# Patient Record
Sex: Female | Born: 1935 | Race: White | Hispanic: No | Smoking: Never smoker
Health system: Southern US, Community
[De-identification: ages and names within clinical notes are randomized; demographics above are authoritative.]

## PROBLEM LIST (undated history)

## (undated) DIAGNOSIS — F419 Anxiety disorder, unspecified: Secondary | ICD-10-CM

## (undated) DIAGNOSIS — R609 Edema, unspecified: Secondary | ICD-10-CM

## (undated) DIAGNOSIS — M1990 Unspecified osteoarthritis, unspecified site: Secondary | ICD-10-CM

## (undated) DIAGNOSIS — I10 Essential (primary) hypertension: Secondary | ICD-10-CM

## (undated) DIAGNOSIS — F0390 Unspecified dementia without behavioral disturbance: Secondary | ICD-10-CM

## (undated) HISTORY — PX: OTHER SURGICAL HISTORY: SHX169

## (undated) HISTORY — DX: Edema, unspecified: R60.9

## (undated) HISTORY — DX: Essential (primary) hypertension: I10

---

## 2012-05-20 ENCOUNTER — Encounter (HOSPITAL_BASED_OUTPATIENT_CLINIC_OR_DEPARTMENT_OTHER): Payer: Self-pay | Admitting: *Deleted

## 2012-05-20 ENCOUNTER — Emergency Department (HOSPITAL_BASED_OUTPATIENT_CLINIC_OR_DEPARTMENT_OTHER)
Admission: EM | Admit: 2012-05-20 | Discharge: 2012-05-21 | Disposition: A | Discharge status: Home / Self Care | Payer: Medicare Other | Attending: Emergency Medicine | Admitting: Emergency Medicine

## 2012-05-20 ENCOUNTER — Emergency Department (HOSPITAL_BASED_OUTPATIENT_CLINIC_OR_DEPARTMENT_OTHER): Payer: Medicare Other

## 2012-05-20 DIAGNOSIS — W1809XA Striking against other object with subsequent fall, initial encounter: Secondary | ICD-10-CM | POA: Insufficient documentation

## 2012-05-20 DIAGNOSIS — S0990XA Unspecified injury of head, initial encounter: Secondary | ICD-10-CM | POA: Insufficient documentation

## 2012-05-20 DIAGNOSIS — Y939 Activity, unspecified: Secondary | ICD-10-CM | POA: Insufficient documentation

## 2012-05-20 DIAGNOSIS — F0390 Unspecified dementia without behavioral disturbance: Secondary | ICD-10-CM | POA: Insufficient documentation

## 2012-05-20 DIAGNOSIS — Y929 Unspecified place or not applicable: Secondary | ICD-10-CM | POA: Insufficient documentation

## 2012-05-20 DIAGNOSIS — S0990XS Unspecified injury of head, sequela: Secondary | ICD-10-CM

## 2012-05-20 DIAGNOSIS — F411 Generalized anxiety disorder: Secondary | ICD-10-CM | POA: Insufficient documentation

## 2012-05-20 DIAGNOSIS — S60229A Contusion of unspecified hand, initial encounter: Secondary | ICD-10-CM | POA: Insufficient documentation

## 2012-05-20 DIAGNOSIS — T148XXA Other injury of unspecified body region, initial encounter: Secondary | ICD-10-CM

## 2012-05-20 DIAGNOSIS — I10 Essential (primary) hypertension: Secondary | ICD-10-CM | POA: Insufficient documentation

## 2012-05-20 DIAGNOSIS — Z8739 Personal history of other diseases of the musculoskeletal system and connective tissue: Secondary | ICD-10-CM | POA: Insufficient documentation

## 2012-05-20 HISTORY — DX: Essential (primary) hypertension: I10

## 2012-05-20 HISTORY — DX: Unspecified dementia, unspecified severity, without behavioral disturbance, psychotic disturbance, mood disturbance, and anxiety: F03.90

## 2012-05-20 HISTORY — DX: Anxiety disorder, unspecified: F41.9

## 2012-05-20 HISTORY — DX: Unspecified osteoarthritis, unspecified site: M19.90

## 2012-05-20 NOTE — ED Notes (Signed)
Pt returned from CT °

## 2012-05-20 NOTE — ED Notes (Signed)
Patient transported to CT 

## 2012-05-20 NOTE — ED Provider Notes (Signed)
History    This chart was scribed for Frances Harshberger Smitty Cords, Frances Jackson by Donne Anon, ED Scribe. This patient was seen in room MH11/MH11 and the patient's care was started at 2323.   CSN: [id number]  Arrival date & time 05/20/12  2131   First Frances Jackson Initiated Contact with Patient 05/20/12 2323      Chief Complaint  Patient presents with  . Head Injury     Patient is a 80 y.o. female presenting with head injury. The history is provided by the patient and a relative. No language interpreter was used.  Head Injury Location:  R parietal Time since incident:  16 hours Mechanism of injury: fall   Pain details:    Severity:  Mild   Timing:  Constant   Progression:  Unchanged Chronicity:  New Relieved by:  None tried Worsened by:  Nothing tried Ineffective treatments:  NSAIDs Associated symptoms: no nausea, no numbness and no vomiting   Risk factors: alcohol intake (bottle of wine per day)    Frances Jackson is a 79 y.o. female who presents to the Emergency Department complaining of sudden onset head injury due to a fall which occurred 16 hours PTA in pts assisted living facility while she was getting out of bed. Her daughter reports she hit her head on the corner of the table and reports associated bruising. She denies LOC, emesis, nausea, or any other pain.  Past Medical History  Diagnosis Date  . Anxiety   . Arthritis   . Dementia   . Hypertension     Past Surgical History  Procedure Laterality Date  . Arm surgery      No family history on file.  History  Substance Use Topics  . Smoking status: Never Smoker   . Smokeless tobacco: Never Used  . Alcohol Use: 16.8 oz/week    28 Glasses of wine per week      Review of Systems  Gastrointestinal: Negative for nausea and vomiting.  Neurological: Negative for numbness.  All other systems reviewed and are negative.    Allergies  Review of patient's allergies indicates no known allergies.  Home Medications   Current  Outpatient Rx  Name  Route  Sig  Dispense  Refill  . amLODipine (NORVASC) 5 MG tablet   Oral   Take 5 mg by mouth daily.         . clonazePAM (KLONOPIN) 0.5 MG tablet   Oral   Take 0.5 mg by mouth 2 (two) times daily as needed for anxiety.         . donepezil (ARICEPT) 10 MG tablet   Oral   Take 10 mg by mouth at bedtime as needed.         . nortriptyline (PAMELOR) 50 MG capsule   Oral   Take 50 mg by mouth at bedtime.           BP 130/75  Pulse 108  Temp(Src) 98.1 F (36.7 C) (Oral)  Resp 16  Ht 5' 4.5" (1.638 m)  Wt 135 lb (61.236 kg)  BMI 22.82 kg/m2  SpO2 95%  Physical Exam  Nursing note and vitals reviewed. Constitutional: She appears well-developed and well-nourished. No distress.  HENT:  Head: Normocephalic and atraumatic.  Right Ear: External ear normal.  Left Ear: External ear normal.  Eyes: Conjunctivae and EOM are normal. Pupils are equal, round, and reactive to light. Right eye exhibits no discharge. Left eye exhibits no discharge. No scleral icterus.  Lens implants bilaterally.  Neck: Normal range of motion. Neck supple.  No crepitance. No midline C spine tenderness.  Cardiovascular: Normal rate, regular rhythm and intact distal pulses.   Pulmonary/Chest: Effort normal and breath sounds normal. No respiratory distress. She has no wheezes. She has no rales.  Abdominal: Soft. Bowel sounds are normal. There is no tenderness. There is no rebound and no guarding.  Musculoskeletal: Normal range of motion.  Neurological: She is alert. She has normal reflexes. She displays normal reflexes. No cranial nerve deficit ( no gross defecits noted).  Skin: Skin is warm and dry.  Ecchymosis right forehead.  Psychiatric: She has a normal mood and affect.    ED Course  Procedures (including critical care time) DIAGNOSTIC STUDIES: Oxygen Saturation is 95% on room air, adequate by my interpretation.    COORDINATION OF CARE: 11:32 PM Discussed treatment plan  which includes head CT with pt at bedside and pt agreed to plan.     Labs Reviewed - No data to display No results found.   No diagnosis found.    MDM  Follow up with your family doctor discontinue drinking wine I personally performed the services described in this documentation, which was scribed in my presence. The recorded information has been reviewed and is accurate.         Jasmine Awe, Frances Jackson 05/20/12 2352

## 2012-05-20 NOTE — ED Notes (Signed)
Pt's family member reports pt lives in [facility] assisted living- fell this am and hit on corner of table- denies loc- bruising to forehead

## 2013-05-04 ENCOUNTER — Encounter (HOSPITAL_BASED_OUTPATIENT_CLINIC_OR_DEPARTMENT_OTHER): Payer: Self-pay | Admitting: Emergency Medicine

## 2013-05-04 ENCOUNTER — Emergency Department (HOSPITAL_BASED_OUTPATIENT_CLINIC_OR_DEPARTMENT_OTHER)
Admission: EM | Admit: 2013-05-04 | Discharge: 2013-05-04 | Disposition: A | Discharge status: Home / Self Care | Payer: Medicare Other | Attending: Emergency Medicine | Admitting: Emergency Medicine

## 2013-05-04 DIAGNOSIS — W19XXXA Unspecified fall, initial encounter: Secondary | ICD-10-CM

## 2013-05-04 DIAGNOSIS — Y9301 Activity, walking, marching and hiking: Secondary | ICD-10-CM | POA: Insufficient documentation

## 2013-05-04 DIAGNOSIS — Y9210 Unspecified residential institution as the place of occurrence of the external cause: Secondary | ICD-10-CM | POA: Insufficient documentation

## 2013-05-04 DIAGNOSIS — I10 Essential (primary) hypertension: Secondary | ICD-10-CM | POA: Insufficient documentation

## 2013-05-04 DIAGNOSIS — IMO0002 Reserved for concepts with insufficient information to code with codable children: Secondary | ICD-10-CM | POA: Insufficient documentation

## 2013-05-04 DIAGNOSIS — F411 Generalized anxiety disorder: Secondary | ICD-10-CM | POA: Insufficient documentation

## 2013-05-04 DIAGNOSIS — S0003XA Contusion of scalp, initial encounter: Secondary | ICD-10-CM | POA: Insufficient documentation

## 2013-05-04 DIAGNOSIS — Z8739 Personal history of other diseases of the musculoskeletal system and connective tissue: Secondary | ICD-10-CM | POA: Insufficient documentation

## 2013-05-04 DIAGNOSIS — T07XXXA Unspecified multiple injuries, initial encounter: Secondary | ICD-10-CM

## 2013-05-04 DIAGNOSIS — F0390 Unspecified dementia without behavioral disturbance: Secondary | ICD-10-CM | POA: Insufficient documentation

## 2013-05-04 DIAGNOSIS — S1093XA Contusion of unspecified part of neck, initial encounter: Principal | ICD-10-CM

## 2013-05-04 DIAGNOSIS — W010XXA Fall on same level from slipping, tripping and stumbling without subsequent striking against object, initial encounter: Secondary | ICD-10-CM | POA: Insufficient documentation

## 2013-05-04 DIAGNOSIS — Z79899 Other long term (current) drug therapy: Secondary | ICD-10-CM | POA: Insufficient documentation

## 2013-05-04 DIAGNOSIS — Z23 Encounter for immunization: Secondary | ICD-10-CM | POA: Insufficient documentation

## 2013-05-04 DIAGNOSIS — W1809XA Striking against other object with subsequent fall, initial encounter: Secondary | ICD-10-CM | POA: Insufficient documentation

## 2013-05-04 DIAGNOSIS — S0083XA Contusion of other part of head, initial encounter: Principal | ICD-10-CM

## 2013-05-04 MED ORDER — TETANUS-DIPHTH-ACELL PERTUSSIS 5-2.5-18.5 LF-MCG/0.5 IM SUSP
0.5000 mL | Freq: Once | INTRAMUSCULAR | Status: AC
  Administered 2013-05-04: 0.5 mL via INTRAMUSCULAR
  Filled 2013-05-04: qty 0.5

## 2013-05-04 NOTE — Discharge Instructions (Signed)
Abrasions An abrasion is a cut or scrape of the skin. Abrasions do not go through all layers of the skin. HOME CARE  If a bandage (dressing) was put on your wound, change it as told by your doctor. If the bandage sticks, soak it off with warm.  Wash the area with water and soap 2 times a day. Rinse off the soap. Pat the area dry with a clean towel.  Put on medicated cream (ointment) as told by your doctor.  Change your bandage right away if it gets wet or dirty.  Only take medicine as told by your doctor.  See your doctor within 24 48 hours to get your wound checked.  Check your wound for redness, puffiness (swelling), or yellowish-white fluid (pus). GET HELP RIGHT AWAY IF:   You have more pain in the wound.  You have redness, swelling, or tenderness around the wound.  You have pus coming from the wound.  You have a fever or lasting symptoms for more than 2 3 days.  You have a fever and your symptoms suddenly get worse.  You have a bad smell coming from the wound or bandage. MAKE SURE YOU:   Understand these instructions.  Will watch your condition.  Will get help right away if you are not doing well or get worse. Document Released: 08/30/2007 Document Revised: 12/06/2011 Document Reviewed: 02/14/2011 [facility] Patient Information 2014 [employer], [place].  Fall Prevention and Home Safety Falls cause injuries and can affect all age groups. It is possible to prevent falls.  HOW TO PREVENT FALLS  Wear shoes with rubber soles that do not have an opening for your toes.  Keep the inside and outside of your house well lit.  Use night lights throughout your home.  Remove clutter from floors.  Clean up floor spills.  Remove throw rugs or fasten them to the floor with carpet tape.  Do not place electrical cords across pathways.  Put grab bars by your tub, shower, and toilet. Do not use towel bars as grab bars.  Put handrails on both sides of the stairway. Fix loose  handrails.  Do not climb on stools or stepladders, if possible.  Do not wax your floors.  Repair uneven or unsafe sidewalks, walkways, or stairs.  Keep items you use a lot within reach.  Be aware of pets.  Keep emergency numbers next to the telephone.  Put smoke detectors in your home and near bedrooms. Ask your doctor what other things you can do to prevent falls. Document Released: 01/07/2009 Document Revised: 09/12/2011 Document Reviewed: 06/13/2011 [facility] Patient Information 2014 [place], [place].

## 2013-05-04 NOTE — ED Notes (Signed)
Patient fell this morning, lives in assisted living. Hit head on the grassy area. Abrasions to right side of face and abrasions to both hands.

## 2013-05-04 NOTE — ED Provider Notes (Signed)
CSN: [id number]     Arrival date & time 05/04/13  1341 History  This chart was scribed for Nelia Shi, MD by Donne Anon, ED Scribe. This patient was seen in room MH08/MH08 and the patient's care was started at 1629.    First MD Initiated Contact with Patient 05/04/13 1629     Chief Complaint  Patient presents with  . Fall    The history is provided by the patient and a relative. No language interpreter was used.   HPI Comments: Frances Jackson is a 80 y.o. female who presents to the Emergency Department complaining of a fall which occurred this morning. She states she did not lose consciousness, but does not remember how she fell. He daughter reports that she tripped while walking outside and hit her head on a grassy area. Her daughter denies LOC. Her daughter thinks that her eyes look droopy. She has some abrasions to the right side of her face and both hands. She is not currently on a blood thinner.   Past Medical History  Diagnosis Date  . Anxiety   . Arthritis   . Dementia   . Hypertension    Past Surgical History  Procedure Laterality Date  . Arm surgery     No family history on file. History  Substance Use Topics  . Smoking status: Never Smoker   . Smokeless tobacco: Never Used  . Alcohol Use: 16.8 oz/week    28 Glasses of wine per week   OB History   Grav Para Term Preterm Abortions TAB SAB Ect Mult Living                 Review of Systems  HENT: Positive for facial swelling.   Skin: Positive for wound.  Neurological: Negative for syncope.  All other systems reviewed and are negative.    Allergies  Review of patient's allergies indicates no known allergies.  Home Medications   Current Outpatient Rx  Name  Route  Sig  Dispense  Refill  . furosemide (LASIX) 20 MG tablet   Oral   Take 20 mg by mouth.         . levothyroxine (SYNTHROID, LEVOTHROID) 25 MCG tablet   Oral   Take 25 mcg by mouth daily before breakfast.         . omeprazole  (PRILOSEC) 40 MG capsule   Oral   Take 40 mg by mouth daily.         Marland Kitchen amLODipine (NORVASC) 5 MG tablet   Oral   Take 5 mg by mouth daily.         . clonazePAM (KLONOPIN) 0.5 MG tablet   Oral   Take 0.5 mg by mouth 2 (two) times daily as needed for anxiety.         . donepezil (ARICEPT) 10 MG tablet   Oral   Take 10 mg by mouth at bedtime as needed.         . nortriptyline (PAMELOR) 50 MG capsule   Oral   Take 50 mg by mouth at bedtime.          BP 101/81  Pulse 98  Temp(Src) 98.2 F (36.8 C) (Oral)  Resp 16  Ht 5' (1.524 m)  Wt 133 lb (60.328 kg)  BMI 25.97 kg/m2  SpO2 100%  Physical Exam  Nursing note and vitals reviewed. Constitutional: She is oriented to person, place, and time. She appears well-developed and well-nourished. No distress.  HENT:  Head:  Normocephalic. Head is with contusion.    Eyes: Pupils are equal, round, and reactive to light.  Neck: Normal range of motion.  Cardiovascular: Normal rate and intact distal pulses.   Pulmonary/Chest: No respiratory distress.  Abdominal: Normal appearance. She exhibits no distension.  Musculoskeletal: Normal range of motion.       Hands: Neurological: She is alert and oriented to person, place, and time. No cranial nerve deficit.  Skin: Skin is warm and dry. No rash noted.  Psychiatric: She has a normal mood and affect. Her behavior is normal.    ED Course  Procedures (including critical care time)  Medications  Tdap (BOOSTRIX) injection 0.5 mL (0.5 mLs Intramuscular Given 05/04/13 1651)    DIAGNOSTIC STUDIES: Oxygen Saturation is 100% on RA, normal by my interpretation.    COORDINATION OF CARE: 4:33 PM Discussed treatment plan which includes wound dressing and tetanus shot with pt and daughter at bedside and they agreed to plan.    Labs Review Labs Reviewed - No data to display Imaging Review No results found.  EKG Interpretation   None       MDM   1. Fall   2. Abrasions of  multiple sites   3. Multiple contusions      I personally performed the services described in this documentation, which was scribed in my presence. The recorded information has been reviewed and considered.   Nelia Shiobert L Pearley Millington, MD 05/04/13 [phone]

## 2014-04-20 ENCOUNTER — Other Ambulatory Visit (HOSPITAL_COMMUNITY): Payer: Self-pay | Admitting: Family Medicine

## 2014-04-20 DIAGNOSIS — R112 Nausea with vomiting, unspecified: Secondary | ICD-10-CM

## 2014-04-23 ENCOUNTER — Ambulatory Visit (HOSPITAL_COMMUNITY): Payer: Medicare Other

## 2014-04-30 ENCOUNTER — Ambulatory Visit (HOSPITAL_COMMUNITY): Payer: Medicare Other

## 2015-02-06 ENCOUNTER — Encounter (HOSPITAL_COMMUNITY): Payer: Self-pay | Admitting: Emergency Medicine

## 2015-02-06 ENCOUNTER — Emergency Department (HOSPITAL_COMMUNITY): Payer: Medicare Other

## 2015-02-06 ENCOUNTER — Inpatient Hospital Stay (HOSPITAL_COMMUNITY)
Admission: EM | Admit: 2015-02-06 | Discharge: 2015-02-09 | DRG: 690 | Disposition: A | Discharge status: Skilled Nursing Facility | Payer: Medicare Other | Attending: Internal Medicine | Admitting: Internal Medicine

## 2015-02-06 ENCOUNTER — Inpatient Hospital Stay (HOSPITAL_COMMUNITY): Payer: Medicare Other

## 2015-02-06 DIAGNOSIS — I10 Essential (primary) hypertension: Secondary | ICD-10-CM | POA: Diagnosis present

## 2015-02-06 DIAGNOSIS — E872 Acidosis, unspecified: Secondary | ICD-10-CM | POA: Diagnosis present

## 2015-02-06 DIAGNOSIS — M6282 Rhabdomyolysis: Secondary | ICD-10-CM | POA: Diagnosis present

## 2015-02-06 DIAGNOSIS — S0232XA Fracture of orbital floor, left side, initial encounter for closed fracture: Secondary | ICD-10-CM | POA: Diagnosis present

## 2015-02-06 DIAGNOSIS — M1990 Unspecified osteoarthritis, unspecified site: Secondary | ICD-10-CM | POA: Diagnosis present

## 2015-02-06 DIAGNOSIS — Y92129 Unspecified place in nursing home as the place of occurrence of the external cause: Secondary | ICD-10-CM | POA: Diagnosis not present

## 2015-02-06 DIAGNOSIS — W19XXXA Unspecified fall, initial encounter: Secondary | ICD-10-CM

## 2015-02-06 DIAGNOSIS — E876 Hypokalemia: Secondary | ICD-10-CM | POA: Diagnosis present

## 2015-02-06 DIAGNOSIS — F419 Anxiety disorder, unspecified: Secondary | ICD-10-CM | POA: Diagnosis present

## 2015-02-06 DIAGNOSIS — W1830XA Fall on same level, unspecified, initial encounter: Secondary | ICD-10-CM | POA: Diagnosis present

## 2015-02-06 DIAGNOSIS — F0390 Unspecified dementia without behavioral disturbance: Secondary | ICD-10-CM | POA: Diagnosis present

## 2015-02-06 DIAGNOSIS — N390 Urinary tract infection, site not specified: Principal | ICD-10-CM | POA: Diagnosis present

## 2015-02-06 DIAGNOSIS — Z66 Do not resuscitate: Secondary | ICD-10-CM | POA: Diagnosis present

## 2015-02-06 DIAGNOSIS — M25569 Pain in unspecified knee: Secondary | ICD-10-CM | POA: Diagnosis present

## 2015-02-06 DIAGNOSIS — Z79899 Other long term (current) drug therapy: Secondary | ICD-10-CM | POA: Diagnosis not present

## 2015-02-06 DIAGNOSIS — S0240DA Maxillary fracture, left side, initial encounter for closed fracture: Secondary | ICD-10-CM | POA: Diagnosis present

## 2015-02-06 DIAGNOSIS — M25562 Pain in left knee: Secondary | ICD-10-CM | POA: Diagnosis present

## 2015-02-06 DIAGNOSIS — S8002XA Contusion of left knee, initial encounter: Secondary | ICD-10-CM | POA: Diagnosis present

## 2015-02-06 LAB — CBC WITH DIFFERENTIAL/PLATELET
Basophils Absolute: 0 K/uL (ref 0.0–0.1)
Basophils Relative: 0 %
Eosinophils Absolute: 0 K/uL (ref 0.0–0.7)
Eosinophils Relative: 0 %
HCT: 44.9 % (ref 36.0–46.0)
Hemoglobin: 15.2 g/dL — ABNORMAL HIGH (ref 12.0–15.0)
Lymphocytes Relative: 7 %
Lymphs Abs: 0.9 K/uL (ref 0.7–4.0)
MCH: 33 pg (ref 26.0–34.0)
MCHC: 33.9 g/dL (ref 30.0–36.0)
MCV: 97.6 fL (ref 78.0–100.0)
Monocytes Absolute: 0.5 K/uL (ref 0.1–1.0)
Monocytes Relative: 4 %
Neutro Abs: 12.1 K/uL — ABNORMAL HIGH (ref 1.7–7.7)
Neutrophils Relative %: 89 %
Platelets: 277 K/uL (ref 150–400)
RBC: 4.6 MIL/uL (ref 3.87–5.11)
RDW: 13 % (ref 11.5–15.5)
WBC: 13.6 K/uL — ABNORMAL HIGH (ref 4.0–10.5)

## 2015-02-06 LAB — COMPREHENSIVE METABOLIC PANEL
ALT: 21 U/L (ref 14–54)
ANION GAP: 13 (ref 5–15)
AST: 33 U/L (ref 15–41)
Albumin: 4.4 g/dL (ref 3.5–5.0)
Alkaline Phosphatase: 53 U/L (ref 38–126)
BILIRUBIN TOTAL: 0.7 mg/dL (ref 0.3–1.2)
BUN: 12 mg/dL (ref 6–20)
CHLORIDE: 102 mmol/L (ref 101–111)
CO2: 24 mmol/L (ref 22–32)
Calcium: 9.5 mg/dL (ref 8.9–10.3)
Creatinine, Ser: 0.75 mg/dL (ref 0.44–1.00)
GFR calc Af Amer: >60 mL/min (ref >60)
Glucose, Bld: 145 mg/dL — ABNORMAL HIGH (ref 65–99)
POTASSIUM: 3.2 mmol/L — AB (ref 3.5–5.1)
Sodium: 139 mmol/L (ref 135–145)
TOTAL PROTEIN: 8 g/dL (ref 6.5–8.1)

## 2015-02-06 LAB — BASIC METABOLIC PANEL
Anion gap: 11 (ref 5–15)
BUN: 9 mg/dL (ref 6–20)
CHLORIDE: 107 mmol/L (ref 101–111)
CO2: 22 mmol/L (ref 22–32)
Calcium: 8.9 mg/dL (ref 8.9–10.3)
Creatinine, Ser: 0.65 mg/dL (ref 0.44–1.00)
GFR calc Af Amer: >60 mL/min (ref >60)
GFR calc non Af Amer: >60 mL/min (ref >60)
Glucose, Bld: 149 mg/dL — ABNORMAL HIGH (ref 65–99)
POTASSIUM: 4.5 mmol/L (ref 3.5–5.1)
SODIUM: 140 mmol/L (ref 135–145)

## 2015-02-06 LAB — LACTIC ACID, PLASMA: Lactic Acid, Venous: 1.8 mmol/L (ref 0.5–2.0)

## 2015-02-06 LAB — URINALYSIS, ROUTINE W REFLEX MICROSCOPIC
Bilirubin Urine: NEGATIVE
Glucose, UA: NEGATIVE mg/dL
Ketones, ur: 15 mg/dL — AB
Nitrite: NEGATIVE
Protein, ur: NEGATIVE mg/dL
Specific Gravity, Urine: 1.015 (ref 1.005–1.030)
Urobilinogen, UA: 1 mg/dL (ref 0.0–1.0)
pH: 7 (ref 5.0–8.0)

## 2015-02-06 LAB — I-STAT CG4 LACTIC ACID, ED
LACTIC ACID, VENOUS: 4.33 mmol/L — AB (ref 0.5–2.0)
Lactic Acid, Venous: 3.35 mmol/L (ref 0.5–2.0)

## 2015-02-06 LAB — URINE MICROSCOPIC-ADD ON

## 2015-02-06 LAB — MAGNESIUM: Magnesium: 2 mg/dL (ref 1.7–2.4)

## 2015-02-06 LAB — CK: Total CK: 457 U/L — ABNORMAL HIGH (ref 38–234)

## 2015-02-06 MED ORDER — DEXTROSE 5 % IV SOLN
1.0000 g | Freq: Once | INTRAVENOUS | Status: AC
  Administered 2015-02-06: 1 g via INTRAVENOUS
  Filled 2015-02-06: qty 10

## 2015-02-06 MED ORDER — SODIUM CHLORIDE 0.9 % IV SOLN
INTRAVENOUS | Status: DC
  Administered 2015-02-06: 17:00:00 via INTRAVENOUS

## 2015-02-06 MED ORDER — NORTRIPTYLINE HCL 10 MG PO CAPS
10.0000 mg | ORAL_CAPSULE | Freq: Every day | ORAL | Status: DC
  Administered 2015-02-06 – 2015-02-08 (×3): 10 mg via ORAL
  Filled 2015-02-06 (×4): qty 1

## 2015-02-06 MED ORDER — SIMVASTATIN 20 MG PO TABS
20.0000 mg | ORAL_TABLET | Freq: Every day | ORAL | Status: DC
  Administered 2015-02-06 – 2015-02-08 (×3): 20 mg via ORAL
  Filled 2015-02-06 (×4): qty 1

## 2015-02-06 MED ORDER — SODIUM CHLORIDE 0.9 % IV BOLUS (SEPSIS)
500.0000 mL | Freq: Once | INTRAVENOUS | Status: AC
  Administered 2015-02-06: 500 mL via INTRAVENOUS

## 2015-02-06 MED ORDER — LEVOTHYROXINE SODIUM 50 MCG PO TABS
50.0000 ug | ORAL_TABLET | Freq: Every day | ORAL | Status: DC
  Administered 2015-02-06 – 2015-02-09 (×4): 50 ug via ORAL
  Filled 2015-02-06 (×5): qty 1

## 2015-02-06 MED ORDER — LORAZEPAM 1 MG PO TABS
0.0000 mg | ORAL_TABLET | Freq: Four times a day (QID) | ORAL | Status: AC
  Administered 2015-02-07: 1 mg via ORAL
  Filled 2015-02-06: qty 1

## 2015-02-06 MED ORDER — LORAZEPAM 2 MG/ML IJ SOLN
0.0000 mg | Freq: Two times a day (BID) | INTRAMUSCULAR | Status: AC
  Administered 2015-02-07: 2 mg via INTRAVENOUS
  Filled 2015-02-06: qty 1

## 2015-02-06 MED ORDER — LORAZEPAM 2 MG/ML IJ SOLN: 0.0000 mg | Freq: Four times a day (QID) | INTRAMUSCULAR | Status: AC

## 2015-02-06 MED ORDER — ACETAMINOPHEN 500 MG PO TABS: 500.0000 mg | ORAL_TABLET | Freq: Two times a day (BID) | ORAL | Status: DC | PRN

## 2015-02-06 MED ORDER — SODIUM CHLORIDE 0.9 % IV SOLN
Freq: Once | INTRAVENOUS | Status: AC
  Administered 2015-02-06: 09:00:00 via INTRAVENOUS

## 2015-02-06 MED ORDER — LORAZEPAM 1 MG PO TABS
0.0000 mg | ORAL_TABLET | Freq: Two times a day (BID) | ORAL | Status: DC
  Administered 2015-02-06 – 2015-02-09 (×2): 1 mg via ORAL
  Filled 2015-02-06 (×2): qty 1

## 2015-02-06 MED ORDER — ENOXAPARIN SODIUM 40 MG/0.4ML ~~LOC~~ SOLN
40.0000 mg | SUBCUTANEOUS | Status: DC
  Administered 2015-02-06 – 2015-02-08 (×3): 40 mg via SUBCUTANEOUS
  Filled 2015-02-06 (×4): qty 0.4

## 2015-02-06 MED ORDER — SODIUM CHLORIDE 0.9 % IV BOLUS (SEPSIS)
1000.0000 mL | Freq: Once | INTRAVENOUS | Status: AC
  Administered 2015-02-06: 1000 mL via INTRAVENOUS

## 2015-02-06 MED ORDER — POTASSIUM CHLORIDE CRYS ER 20 MEQ PO TBCR
20.0000 meq | EXTENDED_RELEASE_TABLET | Freq: Two times a day (BID) | ORAL | Status: DC
  Administered 2015-02-06 – 2015-02-09 (×7): 20 meq via ORAL
  Filled 2015-02-06 (×9): qty 1

## 2015-02-06 MED ORDER — CLONAZEPAM 0.5 MG PO TABS
0.5000 mg | ORAL_TABLET | Freq: Two times a day (BID) | ORAL | Status: DC | PRN
  Administered 2015-02-06 – 2015-02-07 (×4): 0.5 mg via ORAL
  Filled 2015-02-06 (×4): qty 1

## 2015-02-06 MED ORDER — SODIUM CHLORIDE 0.9 % IV BOLUS (SEPSIS)
500.0000 mL | Freq: Once | INTRAVENOUS | Status: AC
Start: 2015-02-06 — End: 2015-02-06
  Administered 2015-02-06: 500 mL via INTRAVENOUS

## 2015-02-06 MED ORDER — VITAMIN B-1 100 MG PO TABS
100.0000 mg | ORAL_TABLET | Freq: Every day | ORAL | Status: DC
  Administered 2015-02-06 – 2015-02-09 (×4): 100 mg via ORAL
  Filled 2015-02-06 (×4): qty 1

## 2015-02-06 MED ORDER — AMLODIPINE BESYLATE 5 MG PO TABS
5.0000 mg | ORAL_TABLET | Freq: Every day | ORAL | Status: DC
  Administered 2015-02-06 – 2015-02-09 (×4): 5 mg via ORAL
  Filled 2015-02-06 (×4): qty 1

## 2015-02-06 MED ORDER — DONEPEZIL HCL 10 MG PO TABS
10.0000 mg | ORAL_TABLET | Freq: Every day | ORAL | Status: DC
  Administered 2015-02-06 – 2015-02-08 (×3): 10 mg via ORAL
  Filled 2015-02-06 (×4): qty 1

## 2015-02-06 MED ORDER — THIAMINE HCL 100 MG/ML IJ SOLN
100.0000 mg | Freq: Every day | INTRAMUSCULAR | Status: DC
  Filled 2015-02-06 (×2): qty 1

## 2015-02-06 MED ORDER — PANTOPRAZOLE SODIUM 40 MG PO TBEC
40.0000 mg | DELAYED_RELEASE_TABLET | Freq: Every day | ORAL | Status: DC
  Administered 2015-02-06 – 2015-02-09 (×4): 40 mg via ORAL
  Filled 2015-02-06 (×3): qty 1

## 2015-02-06 MED ORDER — DEXTROSE 5 % IV SOLN
1.0000 g | INTRAVENOUS | Status: DC
  Administered 2015-02-07 – 2015-02-09 (×3): 1 g via INTRAVENOUS
  Filled 2015-02-06 (×3): qty 10

## 2015-02-06 NOTE — ED Notes (Signed)
Bed: [id number] Expected date:  Expected time:  Means of arrival:  Comments: 40F fall

## 2015-02-06 NOTE — ED Notes (Signed)
2 unsuccessful attempts at drawing labs, RN aware

## 2015-02-06 NOTE — ED Notes (Addendum)
Pt taken to CT scan. C-Collar remains in place.

## 2015-02-06 NOTE — ED Notes (Addendum)
Awake. Verbally responsive. A/O x4. Resp even and unlabored. No audible adventitious breath sounds noted. ABC's intact. SR with occ PVC's on monitor. IV infusing NS without difficulty.

## 2015-02-06 NOTE — ED Notes (Signed)
Pt transported from [facility] after being found on floor @ [phone] by staff, pt last seen 0300. Pt is at baseline mental status, hx of dementia. Bruising noted around L eye and swelling to L cheek. Dried blood noted to L nare.

## 2015-02-06 NOTE — ED Provider Notes (Signed)
CSN: [id number]     Arrival date & time 02/06/15  0612 History   First MD Initiated Contact with Patient 02/06/15 [id number]     Chief Complaint  Patient presents with  . Fall     (Consider location/radiation/quality/duration/timing/severity/associated sxs/prior Treatment) HPI Comments: 81 y.o. Female with history of dementia, HTN, arthritis, anxiety presents following a fall.  The patient reportedly was seen at 3 AM normal this morning at her nursing facility but was then found at 5:20 AM on the floor.  She was at her baseline mental status.  She has a history of dementia and denies recalling what happened of being found on the floor.  Denies pain other than discomfort from the cervical collar she is in.    Patient is a 81 y.o. female presenting with fall.  Fall Pertinent negatives include no chest pain, no abdominal pain, no headaches and no shortness of breath.    Past Medical History  Diagnosis Date  . Anxiety   . Arthritis   . Dementia   . Hypertension    Past Surgical History  Procedure Laterality Date  . Arm surgery     No family history on file. Social History  Substance Use Topics  . Smoking status: Never Smoker   . Smokeless tobacco: Never Used  . Alcohol Use: 16.8 oz/week    28 Glasses of wine per week   OB History    No data available     Review of Systems  Constitutional: Negative for fever, appetite change and fatigue.  HENT: Positive for facial swelling. Negative for congestion and nosebleeds.   Eyes: Negative for visual disturbance.  Respiratory: Negative for cough, chest tightness and shortness of breath.   Cardiovascular: Negative for chest pain.  Gastrointestinal: Negative for abdominal pain.  Genitourinary: Negative for flank pain and pelvic pain.  Neurological: Negative for headaches.  Hematological: Does not bruise/bleed easily.      Allergies  Review of patient's allergies indicates no known allergies.  Home Medications   Prior to Admission  medications   Medication Sig Start Date End Date Taking? Authorizing Provider  acetaminophen (TYLENOL) 325 MG tablet Take 650 mg by mouth 3 (three) times daily.   Yes Historical Provider, MD  acetaminophen (TYLENOL) 500 MG tablet Take 500 mg by mouth every 12 (twelve) hours as needed for mild pain.   Yes Historical Provider, MD  amLODipine (NORVASC) 5 MG tablet Take 5 mg by mouth daily.   Yes Historical Provider, MD  clonazePAM (KLONOPIN) 0.5 MG tablet Take 0.5 mg by mouth 2 (two) times daily as needed for anxiety.   Yes Historical Provider, MD  Cranberry 425 MG CAPS Take 1 capsule by mouth daily.   Yes Historical Provider, MD  donepezil (ARICEPT) 10 MG tablet Take 10 mg by mouth at bedtime.    Yes Historical Provider, MD  furosemide (LASIX) 20 MG tablet Take 20 mg by mouth daily.    Yes Historical Provider, MD  levothyroxine (SYNTHROID, LEVOTHROID) 50 MCG tablet Take 50 mcg by mouth daily before breakfast.   Yes Historical Provider, MD  mineral oil external liquid Place 2 drops into the right ear once a week.   Yes Historical Provider, MD  NON FORMULARY Take 6 oz by mouth 2 (two) times daily. 6 oz of wine.   Yes Historical Provider, MD  nortriptyline (PAMELOR) 10 MG capsule Take 10 mg by mouth at bedtime.   Yes Historical Provider, MD  omeprazole (PRILOSEC) 20 MG capsule Take 20 mg by  mouth 2 (two) times daily.   Yes Historical Provider, MD  simvastatin (ZOCOR) 20 MG tablet Take 20 mg by mouth at bedtime.   Yes Historical Provider, MD   BP 145/59 mmHg  Pulse 102  Temp(Src) 98.6 F (37 C) (Oral)  Resp 21  Ht 5\' 4"  (1.626 m)  Wt 156 lb 15.5 oz (71.2 kg)  BMI 26.93 kg/m2  SpO2 92% Physical Exam  Constitutional: She appears well-developed and well-nourished. No distress. Cervical collar in place.  HENT:  Head: Normocephalic. Head is with contusion.    Right Ear: External ear normal.  Left Ear: External ear normal.  Nose: Nose normal.  Mouth/Throat: Oropharynx is clear and moist. No  oropharyngeal exudate.  Eyes: EOM are normal. Pupils are equal, round, and reactive to light.  Neck: Normal range of motion. Neck supple.  Cardiovascular: Regular rhythm, normal heart sounds and intact distal pulses.  Tachycardia present.   No murmur heard. Pulmonary/Chest: Effort normal. No respiratory distress. She has no wheezes. She has no rales. She exhibits tenderness (right mid ribs).  Abdominal: Soft. She exhibits no distension. There is no tenderness. There is no guarding.  Musculoskeletal: Normal range of motion. She exhibits no edema or tenderness.  Ranged all extremities without tenderness or pain.  Neurological: She is alert.  Oriented to person and able to say she is in the hospital.  Moves all extremities without pain or difficulty.  Skin: Skin is warm and dry. No rash noted. She is not diaphoretic.  Vitals reviewed.   ED Course  Procedures (including critical care time) Labs Review Labs Reviewed  CBC WITH DIFFERENTIAL/PLATELET - Abnormal; Notable for the following:    WBC 13.6 (*)    Hemoglobin 15.2 (*)    Neutro Abs 12.1 (*)    All other components within normal limits  COMPREHENSIVE METABOLIC PANEL - Abnormal; Notable for the following:    Potassium 3.2 (*)    Glucose, Bld 145 (*)    All other components within normal limits  URINALYSIS, ROUTINE W REFLEX MICROSCOPIC (NOT [facility]) - Abnormal; Notable for the following:    APPearance CLOUDY (*)    Hgb urine dipstick TRACE (*)    Ketones, ur 15 (*)    Leukocytes, UA MODERATE (*)    All other components within normal limits  CK - Abnormal; Notable for the following:    Total CK 457 (*)    All other components within normal limits  URINE MICROSCOPIC-ADD ON - Abnormal; Notable for the following:    Squamous Epithelial / LPF FEW (*)    Bacteria, UA MANY (*)    All other components within normal limits  BASIC METABOLIC PANEL - Abnormal; Notable for the following:    Glucose, Bld 149 (*)    All other components  within normal limits  I-STAT CG4 LACTIC ACID, ED - Abnormal; Notable for the following:    Lactic Acid, Venous 4.33 (*)    All other components within normal limits  I-STAT CG4 LACTIC ACID, ED - Abnormal; Notable for the following:    Lactic Acid, Venous 3.35 (*)    All other components within normal limits  URINE CULTURE  LACTIC ACID, PLASMA  MAGNESIUM  TSH  CK  LACTIC ACID, PLASMA  CBC    Imaging Review Dg Ribs Unilateral W/chest Right  02/06/2015  CLINICAL DATA:  Right chest wall pain after a fall. Bruising to the face. Found on the floor at her nursing home. EXAM: RIGHT RIBS AND CHEST -  3+ VIEW COMPARISON:  None. FINDINGS: No convincing rib fracture.  No rib lesion. Deformity of the right humeral head is consistent with old fracture. Bony thorax is diffusely demineralized. Cardiac silhouette is borderline enlarged. No mediastinal or hilar masses or convincing adenopathy. Lungs show prominent bronchovascular markings but no convincing edema or evidence of pneumonia. No pleural effusion or pneumothorax. IMPRESSION: 1. No rib fracture or rib lesion. 2. No acute cardiopulmonary disease. Electronically Signed   By: Amie Portland M.D.   On: 02/06/2015 08:03   Ct Head Wo Contrast  02/06/2015  CLINICAL DATA:  Nurse note: found on floor @ 0520 by staff, pt last seen 0300. Pt is at baseline mental status, hx of dementia. Bruising noted around L eye and swelling to L cheek. Dried blood noted to L nare EXAM: CT HEAD WITHOUT CONTRAST CT MAXILLOFACIAL WITHOUT CONTRAST CT CERVICAL SPINE WITHOUT CONTRAST TECHNIQUE: Multidetector CT imaging of the head, cervical spine, and maxillofacial structures were performed using the standard protocol without intravenous contrast. Multiplanar CT image reconstructions of the cervical spine and maxillofacial structures were also generated. COMPARISON:  05/20/2012 FINDINGS: CT HEAD FINDINGS Ventricles are normal in configuration. There is ventricular sulcal enlargement  reflecting moderate generalized atrophy. No parenchymal masses or mass effect. No evidence of a cortical infarct. Patchy white matter hypoattenuation is noted, stable, consistent with moderate chronic microvascular ischemic change. There are no extra-axial masses or abnormal fluid collections. There is no intracranial hemorrhage. No skull fracture. CT MAXILLOFACIAL FINDINGS Mouth fractures of the left maxillary sinus, along the lateral wall cul mild comminution but no significant displacement. The fracture appears to involve the posterior aspect of the orbital floor without significant depression. Fracture also involves the anterior inferior maxillary sinus wall without significant displacement or depression. No other fractures. There is dependent hemorrhage in the left maxillary sinus. There is left cheek and left preseptal periorbital soft tissue edema/ contusion. Small within cysts are noted consistent with previous bilateral cataract surgery. Globes and orbits are otherwise unremarkable. Remaining sinuses are essentially clear. Clear mastoid air cells and middle ear cavities. No soft tissue masses or adenopathy. CT CERVICAL SPINE FINDINGS No fracture. No spondylolisthesis. There are disc degenerative changes from C3-C4 through C7-T1 with loss of disc height, marked from C4-C5 through C6-C7, and endplate osteophytes. There are facet degenerative changes bilaterally most evident on the right at C2-C3 and C3-C4. Bones are diffusely demineralized. Soft tissues are unremarkable other than carotid vascular calcifications. Scarring is noted in the lung apices. IMPRESSION: HEAD CT:  No acute intracranial abnormalities.  No skull fracture. MAXILLOFACIAL CT: Fractures of the anterior and lateral left maxillary sinus wall without significant displacement. There may be additional nondisplaced fracture of the posterior left orbital floor. Dependent hemorrhage is noted in the left maxillary sinus. No other fractures. CERVICAL  CT:  No fracture or acute finding. Electronically Signed   By: Amie Portland M.D.   On: 02/06/2015 07:38   Ct Cervical Spine Wo Contrast  02/06/2015  CLINICAL DATA:  Nurse note: found on floor @ 0520 by staff, pt last seen 0300. Pt is at baseline mental status, hx of dementia. Bruising noted around L eye and swelling to L cheek. Dried blood noted to L nare EXAM: CT HEAD WITHOUT CONTRAST CT MAXILLOFACIAL WITHOUT CONTRAST CT CERVICAL SPINE WITHOUT CONTRAST TECHNIQUE: Multidetector CT imaging of the head, cervical spine, and maxillofacial structures were performed using the standard protocol without intravenous contrast. Multiplanar CT image reconstructions of the cervical spine and maxillofacial structures were also  generated. COMPARISON:  05/20/2012 FINDINGS: CT HEAD FINDINGS Ventricles are normal in configuration. There is ventricular sulcal enlargement reflecting moderate generalized atrophy. No parenchymal masses or mass effect. No evidence of a cortical infarct. Patchy white matter hypoattenuation is noted, stable, consistent with moderate chronic microvascular ischemic change. There are no extra-axial masses or abnormal fluid collections. There is no intracranial hemorrhage. No skull fracture. CT MAXILLOFACIAL FINDINGS Mouth fractures of the left maxillary sinus, along the lateral wall cul mild comminution but no significant displacement. The fracture appears to involve the posterior aspect of the orbital floor without significant depression. Fracture also involves the anterior inferior maxillary sinus wall without significant displacement or depression. No other fractures. There is dependent hemorrhage in the left maxillary sinus. There is left cheek and left preseptal periorbital soft tissue edema/ contusion. Small within cysts are noted consistent with previous bilateral cataract surgery. Globes and orbits are otherwise unremarkable. Remaining sinuses are essentially clear. Clear mastoid air cells and  middle ear cavities. No soft tissue masses or adenopathy. CT CERVICAL SPINE FINDINGS No fracture. No spondylolisthesis. There are disc degenerative changes from C3-C4 through C7-T1 with loss of disc height, marked from C4-C5 through C6-C7, and endplate osteophytes. There are facet degenerative changes bilaterally most evident on the right at C2-C3 and C3-C4. Bones are diffusely demineralized. Soft tissues are unremarkable other than carotid vascular calcifications. Scarring is noted in the lung apices. IMPRESSION: HEAD CT:  No acute intracranial abnormalities.  No skull fracture. MAXILLOFACIAL CT: Fractures of the anterior and lateral left maxillary sinus wall without significant displacement. There may be additional nondisplaced fracture of the posterior left orbital floor. Dependent hemorrhage is noted in the left maxillary sinus. No other fractures. CERVICAL CT:  No fracture or acute finding. Electronically Signed   By: Amie Portland M.D.   On: 02/06/2015 07:38   Dg Knee Complete 4 Views Left  02/06/2015  CLINICAL DATA:  Bruising, swelling, pain to anterior knee after fall. Unwitnessed fall. EXAM: LEFT KNEE - COMPLETE 4+ VIEW COMPARISON:  None. FINDINGS: There is mild tricompartmental degenerative joint space narrowing. No large osteophytes or other secondary signs of advanced degenerative osteoarthritis. Osseous alignment is otherwise normal. No fracture line or displaced fracture fragment identified. No acute- appearing cortical irregularity or osseous lesion. No appreciable joint effusion. Scattered atherosclerotic changes noted within the soft tissues posterior to the left knee. Superficial soft tissues otherwise unremarkable. IMPRESSION: No acute findings.  No osseous fracture or dislocation. Electronically Signed   By: Bary Richard M.D.   On: 02/06/2015 18:12   Ct Maxillofacial Wo Cm  02/06/2015  CLINICAL DATA:  Nurse note: found on floor @ 0520 by staff, pt last seen 0300. Pt is at baseline mental  status, hx of dementia. Bruising noted around L eye and swelling to L cheek. Dried blood noted to L nare EXAM: CT HEAD WITHOUT CONTRAST CT MAXILLOFACIAL WITHOUT CONTRAST CT CERVICAL SPINE WITHOUT CONTRAST TECHNIQUE: Multidetector CT imaging of the head, cervical spine, and maxillofacial structures were performed using the standard protocol without intravenous contrast. Multiplanar CT image reconstructions of the cervical spine and maxillofacial structures were also generated. COMPARISON:  05/20/2012 FINDINGS: CT HEAD FINDINGS Ventricles are normal in configuration. There is ventricular sulcal enlargement reflecting moderate generalized atrophy. No parenchymal masses or mass effect. No evidence of a cortical infarct. Patchy white matter hypoattenuation is noted, stable, consistent with moderate chronic microvascular ischemic change. There are no extra-axial masses or abnormal fluid collections. There is no intracranial hemorrhage. No skull fracture. CT  MAXILLOFACIAL FINDINGS Mouth fractures of the left maxillary sinus, along the lateral wall cul mild comminution but no significant displacement. The fracture appears to involve the posterior aspect of the orbital floor without significant depression. Fracture also involves the anterior inferior maxillary sinus wall without significant displacement or depression. No other fractures. There is dependent hemorrhage in the left maxillary sinus. There is left cheek and left preseptal periorbital soft tissue edema/ contusion. Small within cysts are noted consistent with previous bilateral cataract surgery. Globes and orbits are otherwise unremarkable. Remaining sinuses are essentially clear. Clear mastoid air cells and middle ear cavities. No soft tissue masses or adenopathy. CT CERVICAL SPINE FINDINGS No fracture. No spondylolisthesis. There are disc degenerative changes from C3-C4 through C7-T1 with loss of disc height, marked from C4-C5 through C6-C7, and endplate  osteophytes. There are facet degenerative changes bilaterally most evident on the right at C2-C3 and C3-C4. Bones are diffusely demineralized. Soft tissues are unremarkable other than carotid vascular calcifications. Scarring is noted in the lung apices. IMPRESSION: HEAD CT:  No acute intracranial abnormalities.  No skull fracture. MAXILLOFACIAL CT: Fractures of the anterior and lateral left maxillary sinus wall without significant displacement. There may be additional nondisplaced fracture of the posterior left orbital floor. Dependent hemorrhage is noted in the left maxillary sinus. No other fractures. CERVICAL CT:  No fracture or acute finding. Electronically Signed   By: Amie Portland M.D.   On: 02/06/2015 07:38   I have personally reviewed and evaluated these images and lab results as part of my medical decision-making.   EKG Interpretation   Date/Time:  Saturday February 06 2015 06:41:52 EST Ventricular Rate:  100 PR Interval:  196 QRS Duration: 81 QT Interval:  391 QTC Calculation: 504 R Axis:   -18 Text Interpretation:  Fast sinus arrhythmia Probable left atrial  enlargement Low voltage, precordial leads Probable LVH with secondary  repol abnrm Baseline wander in lead(s) V1 No previous ECGs available  Confirmed by NGUYEN, EMILY ([id number]) on 02/06/2015 8:40:58 AM      MDM  Patient seen and evaluated in stable condition.  Unwitnessed fall with limited details.  Labs revealed leukocytosis, lactic acidosis, UTI.  Imaging negative for acute process.  C collar removed and patient able to range neck without pain.  Patient started on Rocephin for UTI.  Lactic acid remained elevated after 2L NS.  Discussed with hospitalist who agreed with admission.  Patient was admitted under her care for further treatment and evaluation. Final diagnoses:  UTI (lower urinary tract infection)  Lactic acidosis    1. UTI  2. Lactic acidosis  3. Unwitnessed fall    Leta Baptist, MD 02/06/15 2232

## 2015-02-06 NOTE — ED Notes (Addendum)
Pt assisted to bedside commode. Gait steady. Denies dizziness/lightheadedness, visual disturbances, headaches or nausea. Attempted to remove from O2 and noted de-sating to 88% RA. Reapply O2 at 2lpm via [place]. Family at bedside.

## 2015-02-06 NOTE — ED Notes (Signed)
Dr. Cyndie ChimeNguyen notified of 4.33 Lactic

## 2015-02-06 NOTE — ED Notes (Signed)
Pt taken to xray 

## 2015-02-06 NOTE — ED Notes (Signed)
Awake. Verbally responsive. A/O x2. Resp even and unlabored. No audible adventitious breath sounds noted. ABC's intact. SR on monitor. Pt requesting c-collar to be removed and explained multiple times the purpose of c-collar and waiting on test results. Noted facial bruising.

## 2015-02-06 NOTE — H&P (Signed)
[facility] History and Physical  Dawnn Nam ZOX:[id number] DOB: 06/29/1921 DOA: 02/06/2015  Referring physician: EDP PCP: No primary care provider on file.   Chief Complaint: was found on the floor at the SNF.  HPI: Frances Jackson is a 81 y.o. female WITH prior h/o hypertension, dementia, anxiety, arthritis, presents to [facility] long ED with a fall. She was found to have a left bruised eye, bruised left knee and doesn't remember how she ended up on the floor. She is baseline confused and  Has dementia. She denies any pain other in the knee. She denies fever or chills. No family atbedside when i saw the patient. Her initial lab work revealed elevated CK, lactic acid and UA consistent with UTI. Ct HEAD and neck showed left cheek , left preseptal, periorbital soft tissue edema and contusion. She was referred to medical service for admission for UTI and fall.    Review of Systems:     Past Medical History  Diagnosis Date  . Anxiety   . Arthritis   . Dementia   . Hypertension    Past Surgical History  Procedure Laterality Date  . Arm surgery     Social History:  reports that she has never smoked. She has never used smokeless tobacco. She reports that she drinks about 16.8 oz of alcohol per week. She reports that she does not use illicit drugs.  No Known Allergies  No family history on file.patient denies any known family history.   Prior to Admission medications   Medication Sig Start Date End Date Taking? Authorizing Provider  acetaminophen (TYLENOL) 325 MG tablet Take 650 mg by mouth 3 (three) times daily.   Yes Historical Provider, MD  acetaminophen (TYLENOL) 500 MG tablet Take 500 mg by mouth every 12 (twelve) hours as needed for mild pain.   Yes Historical Provider, MD  amLODipine (NORVASC) 5 MG tablet Take 5 mg by mouth daily.   Yes Historical Provider, MD  clonazePAM (KLONOPIN) 0.5 MG tablet Take 0.5 mg by mouth 2 (two) times daily as needed for anxiety.    Yes Historical Provider, MD  Cranberry 425 MG CAPS Take 1 capsule by mouth daily.   Yes Historical Provider, MD  donepezil (ARICEPT) 10 MG tablet Take 10 mg by mouth at bedtime.    Yes Historical Provider, MD  furosemide (LASIX) 20 MG tablet Take 20 mg by mouth daily.    Yes Historical Provider, MD  levothyroxine (SYNTHROID, LEVOTHROID) 50 MCG tablet Take 50 mcg by mouth daily before breakfast.   Yes Historical Provider, MD  mineral oil external liquid Place 2 drops into the right ear once a week.   Yes Historical Provider, MD  NON FORMULARY Take 6 oz by mouth 2 (two) times daily. 6 oz of wine.   Yes Historical Provider, MD  nortriptyline (PAMELOR) 10 MG capsule Take 10 mg by mouth at bedtime.   Yes Historical Provider, MD  omeprazole (PRILOSEC) 20 MG capsule Take 20 mg by mouth 2 (two) times daily.   Yes Historical Provider, MD  simvastatin (ZOCOR) 20 MG tablet Take 20 mg by mouth at bedtime.   Yes Historical Provider, MD   Physical Exam: Filed Vitals:   02/06/15 1106 02/06/15 1200 02/06/15 1313 02/06/15 1400  BP: 157/61 172/86 168/62   Pulse: 95 91 97   Temp:   97.6 F (36.4 C)   TempSrc:   Oral   Resp: 22 22 18    Height:    5\' 4"  (1.626 m)  Weight:    71.2 kg (156 lb 15.5 oz)  SpO2: 95% 94% 98%     Wt Readings from Last 3 Encounters:  02/06/15 71.2 kg (156 lb 15.5 oz)  05/04/13 60.328 kg (133 lb)  05/20/12 61.236 kg (135 lb)    General:  Appears calm and comfortable Eyes: PERRL, bruised left eye. Neck: no LAD, masses or thyromegaly Cardiovascular: RRR, no m/r/g. No LE edema. Telemetry: SR, no arrhythmias  Respiratory: CTA bilaterally, no w/r/r. Normal respiratory effort. Abdomen: soft, ntnd Skin: bruise left periorbital area, bruise over the left knee.  Musculoskeletal: grossly normal tone BUE/BLE Neurologic: oriented to person and place and not totime. Able to move all extremities, and follow commands speech normal..          Labs on Admission:  Basic Metabolic  Panel:  Recent Labs Lab 02/06/15 0655  NA 139  K 3.2*  CL 102  CO2 24  GLUCOSE 145*  BUN 12  CREATININE 0.75  CALCIUM 9.5   Liver Function Tests:  Recent Labs Lab 02/06/15 0655  AST 33  ALT 21  ALKPHOS 53  BILITOT 0.7  PROT 8.0  ALBUMIN 4.4   No results for input(s): LIPASE, AMYLASE in the last 168 hours. No results for input(s): AMMONIA in the last 168 hours. CBC:  Recent Labs Lab 02/06/15 0655  WBC 13.6*  NEUTROABS 12.1*  HGB 15.2*  HCT 44.9  MCV 97.6  PLT 277   Cardiac Enzymes:  Recent Labs Lab 02/06/15 0655  CKTOTAL 457*    BNP (last 3 results) No results for input(s): BNP in the last 8760 hours.  ProBNP (last 3 results) No results for input(s): PROBNP in the last 8760 hours.  CBG: No results for input(s): GLUCAP in the last 168 hours.  Radiological Exams on Admission: Dg Ribs Unilateral W/chest Right  02/06/2015  CLINICAL DATA:  Right chest wall pain after a fall. Bruising to the face. Found on the floor at her nursing home. EXAM: RIGHT RIBS AND CHEST - 3+ VIEW COMPARISON:  None. FINDINGS: No convincing rib fracture.  No rib lesion. Deformity of the right humeral head is consistent with old fracture. Bony thorax is diffusely demineralized. Cardiac silhouette is borderline enlarged. No mediastinal or hilar masses or convincing adenopathy. Lungs show prominent bronchovascular markings but no convincing edema or evidence of pneumonia. No pleural effusion or pneumothorax. IMPRESSION: 1. No rib fracture or rib lesion. 2. No acute cardiopulmonary disease. Electronically Signed   By: Amie Portland M.D.   On: 02/06/2015 08:03   Ct Head Wo Contrast  02/06/2015  CLINICAL DATA:  Nurse note: found on floor @ 0520 by staff, pt last seen 0300. Pt is at baseline mental status, hx of dementia. Bruising noted around L eye and swelling to L cheek. Dried blood noted to L nare EXAM: CT HEAD WITHOUT CONTRAST CT MAXILLOFACIAL WITHOUT CONTRAST CT CERVICAL SPINE WITHOUT  CONTRAST TECHNIQUE: Multidetector CT imaging of the head, cervical spine, and maxillofacial structures were performed using the standard protocol without intravenous contrast. Multiplanar CT image reconstructions of the cervical spine and maxillofacial structures were also generated. COMPARISON:  05/20/2012 FINDINGS: CT HEAD FINDINGS Ventricles are normal in configuration. There is ventricular sulcal enlargement reflecting moderate generalized atrophy. No parenchymal masses or mass effect. No evidence of a cortical infarct. Patchy white matter hypoattenuation is noted, stable, consistent with moderate chronic microvascular ischemic change. There are no extra-axial masses or abnormal fluid collections. There is no intracranial hemorrhage. No skull fracture. CT MAXILLOFACIAL FINDINGS  Mouth fractures of the left maxillary sinus, along the lateral wall cul mild comminution but no significant displacement. The fracture appears to involve the posterior aspect of the orbital floor without significant depression. Fracture also involves the anterior inferior maxillary sinus wall without significant displacement or depression. No other fractures. There is dependent hemorrhage in the left maxillary sinus. There is left cheek and left preseptal periorbital soft tissue edema/ contusion. Small within cysts are noted consistent with previous bilateral cataract surgery. Globes and orbits are otherwise unremarkable. Remaining sinuses are essentially clear. Clear mastoid air cells and middle ear cavities. No soft tissue masses or adenopathy. CT CERVICAL SPINE FINDINGS No fracture. No spondylolisthesis. There are disc degenerative changes from C3-C4 through C7-T1 with loss of disc height, marked from C4-C5 through C6-C7, and endplate osteophytes. There are facet degenerative changes bilaterally most evident on the right at C2-C3 and C3-C4. Bones are diffusely demineralized. Soft tissues are unremarkable other than carotid vascular  calcifications. Scarring is noted in the lung apices. IMPRESSION: HEAD CT:  No acute intracranial abnormalities.  No skull fracture. MAXILLOFACIAL CT: Fractures of the anterior and lateral left maxillary sinus wall without significant displacement. There may be additional nondisplaced fracture of the posterior left orbital floor. Dependent hemorrhage is noted in the left maxillary sinus. No other fractures. CERVICAL CT:  No fracture or acute finding. Electronically Signed   By: Amie Portland M.D.   On: 02/06/2015 07:38   Ct Cervical Spine Wo Contrast  02/06/2015  CLINICAL DATA:  Nurse note: found on floor @ 0520 by staff, pt last seen 0300. Pt is at baseline mental status, hx of dementia. Bruising noted around L eye and swelling to L cheek. Dried blood noted to L nare EXAM: CT HEAD WITHOUT CONTRAST CT MAXILLOFACIAL WITHOUT CONTRAST CT CERVICAL SPINE WITHOUT CONTRAST TECHNIQUE: Multidetector CT imaging of the head, cervical spine, and maxillofacial structures were performed using the standard protocol without intravenous contrast. Multiplanar CT image reconstructions of the cervical spine and maxillofacial structures were also generated. COMPARISON:  05/20/2012 FINDINGS: CT HEAD FINDINGS Ventricles are normal in configuration. There is ventricular sulcal enlargement reflecting moderate generalized atrophy. No parenchymal masses or mass effect. No evidence of a cortical infarct. Patchy white matter hypoattenuation is noted, stable, consistent with moderate chronic microvascular ischemic change. There are no extra-axial masses or abnormal fluid collections. There is no intracranial hemorrhage. No skull fracture. CT MAXILLOFACIAL FINDINGS Mouth fractures of the left maxillary sinus, along the lateral wall cul mild comminution but no significant displacement. The fracture appears to involve the posterior aspect of the orbital floor without significant depression. Fracture also involves the anterior inferior maxillary  sinus wall without significant displacement or depression. No other fractures. There is dependent hemorrhage in the left maxillary sinus. There is left cheek and left preseptal periorbital soft tissue edema/ contusion. Small within cysts are noted consistent with previous bilateral cataract surgery. Globes and orbits are otherwise unremarkable. Remaining sinuses are essentially clear. Clear mastoid air cells and middle ear cavities. No soft tissue masses or adenopathy. CT CERVICAL SPINE FINDINGS No fracture. No spondylolisthesis. There are disc degenerative changes from C3-C4 through C7-T1 with loss of disc height, marked from C4-C5 through C6-C7, and endplate osteophytes. There are facet degenerative changes bilaterally most evident on the right at C2-C3 and C3-C4. Bones are diffusely demineralized. Soft tissues are unremarkable other than carotid vascular calcifications. Scarring is noted in the lung apices. IMPRESSION: HEAD CT:  No acute intracranial abnormalities.  No skull fracture. MAXILLOFACIAL CT:  Fractures of the anterior and lateral left maxillary sinus wall without significant displacement. There may be additional nondisplaced fracture of the posterior left orbital floor. Dependent hemorrhage is noted in the left maxillary sinus. No other fractures. CERVICAL CT:  No fracture or acute finding. Electronically Signed   By: Amie Portland M.D.   On: 02/06/2015 07:38   Ct Maxillofacial Wo Cm  02/06/2015  CLINICAL DATA:  Nurse note: found on floor @ 0520 by staff, pt last seen 0300. Pt is at baseline mental status, hx of dementia. Bruising noted around L eye and swelling to L cheek. Dried blood noted to L nare EXAM: CT HEAD WITHOUT CONTRAST CT MAXILLOFACIAL WITHOUT CONTRAST CT CERVICAL SPINE WITHOUT CONTRAST TECHNIQUE: Multidetector CT imaging of the head, cervical spine, and maxillofacial structures were performed using the standard protocol without intravenous contrast. Multiplanar CT image reconstructions  of the cervical spine and maxillofacial structures were also generated. COMPARISON:  05/20/2012 FINDINGS: CT HEAD FINDINGS Ventricles are normal in configuration. There is ventricular sulcal enlargement reflecting moderate generalized atrophy. No parenchymal masses or mass effect. No evidence of a cortical infarct. Patchy white matter hypoattenuation is noted, stable, consistent with moderate chronic microvascular ischemic change. There are no extra-axial masses or abnormal fluid collections. There is no intracranial hemorrhage. No skull fracture. CT MAXILLOFACIAL FINDINGS Mouth fractures of the left maxillary sinus, along the lateral wall cul mild comminution but no significant displacement. The fracture appears to involve the posterior aspect of the orbital floor without significant depression. Fracture also involves the anterior inferior maxillary sinus wall without significant displacement or depression. No other fractures. There is dependent hemorrhage in the left maxillary sinus. There is left cheek and left preseptal periorbital soft tissue edema/ contusion. Small within cysts are noted consistent with previous bilateral cataract surgery. Globes and orbits are otherwise unremarkable. Remaining sinuses are essentially clear. Clear mastoid air cells and middle ear cavities. No soft tissue masses or adenopathy. CT CERVICAL SPINE FINDINGS No fracture. No spondylolisthesis. There are disc degenerative changes from C3-C4 through C7-T1 with loss of disc height, marked from C4-C5 through C6-C7, and endplate osteophytes. There are facet degenerative changes bilaterally most evident on the right at C2-C3 and C3-C4. Bones are diffusely demineralized. Soft tissues are unremarkable other than carotid vascular calcifications. Scarring is noted in the lung apices. IMPRESSION: HEAD CT:  No acute intracranial abnormalities.  No skull fracture. MAXILLOFACIAL CT: Fractures of the anterior and lateral left maxillary sinus wall  without significant displacement. There may be additional nondisplaced fracture of the posterior left orbital floor. Dependent hemorrhage is noted in the left maxillary sinus. No other fractures. CERVICAL CT:  No fracture or acute finding. Electronically Signed   By: Amie Portland M.D.   On: 02/06/2015 07:38    EKG: Independently reviewed. Sinus arrythmia, with left atrial arrythmia.   Assessment/Plan Active Problems:   Lactic acidosis   UTI (lower urinary tract infection)   Fall with elevated CK level/ lactic acidosis: Possibly from dehydration and UTI. Gentle hydration and repeat CK, and lactic acid in am.  Started her on IV rocephin, get urine cultures and physical therapy eval.    Hypertension: Controlled.    Left knee pain: Bruise with some swelling surrounding it.  X ray of the knee ordered.     Code Status: DNR, confirmed by RN.  DVT Prophylaxis: Family Communication: NONE AT bedside. Will call daughter in am.  Disposition Plan: possibly back to SNF, when her lactic acid levels normalize and she is  back to baseline.   Time spent: 55 min  [facility] LLCKULA,Karolee Meloni [facility] Pager [phone]

## 2015-02-06 NOTE — ED Notes (Signed)
Patient transported to Radiology 

## 2015-02-06 NOTE — ED Notes (Addendum)
Awake. Verbally responsive. A/O x4. Resp even and unlabored. No audible adventitious breath sounds noted. ABC's intact. SR with occ PVC's on monitor.

## 2015-02-06 NOTE — ED Notes (Addendum)
Awake. Verbally responsive. A/O x4. Resp even and unlabored. No audible adventitious breath sounds noted. ABC's intact. SR with occ PVC's on monitor. IV infusing NS without difficulty. 

## 2015-02-06 NOTE — ED Notes (Signed)
Dr Cyndie ChimeNguyen aware of 3.35 Lactic

## 2015-02-07 LAB — LACTIC ACID, PLASMA: Lactic Acid, Venous: 1.5 mmol/L (ref 0.5–2.0)

## 2015-02-07 LAB — CBC
HEMATOCRIT: 40.6 % (ref 36.0–46.0)
Hemoglobin: 13 g/dL (ref 12.0–15.0)
MCH: 31.8 pg (ref 26.0–34.0)
MCHC: 32 g/dL (ref 30.0–36.0)
MCV: 99.3 fL (ref 78.0–100.0)
Platelets: 252 10*3/uL (ref 150–400)
RBC: 4.09 MIL/uL (ref 3.87–5.11)
RDW: 13.6 % (ref 11.5–15.5)
WBC: 5.7 10*3/uL (ref 4.0–10.5)

## 2015-02-07 LAB — TSH: TSH: 2.806 u[IU]/mL (ref 0.350–4.500)

## 2015-02-07 LAB — CK: Total CK: 694 U/L — ABNORMAL HIGH (ref 38–234)

## 2015-02-07 NOTE — Progress Notes (Signed)
[facility] PROGRESS NOTE  Frances Jackson ZOX:[id number] DOB: [birth year] DOA: 02/06/2015 PCP: No primary care provider on file.  Assessment/Plan: Fall with elevated CK level/ lactic acidosis: Possibly from dehydration and UTI. Gentle hydration and repeat CK shows mild elevation.  Lactic acid is normal on fluid resuscitation.   Started her on IV rocephin, get urine cultures and physical therapy eval.    Hypertension: Controlled.    Left knee pain: Bruise with some swelling surrounding it.  X ray of the knee ordered, does not show any fractures or fluid.   Code Status: DNR.  Family Communication: none at bedside Disposition Plan: pending. Back to SNF ON Monday.    Consultants:  none  Procedures:  none  Antibiotics:  Rocephin   HPI/Subjective: No new complaints.   Objective: Filed Vitals:   02/07/15 1423  BP: 158/65  Pulse: 93  Temp: 98.1 F (36.7 C)  Resp: 18    Intake/Output Summary (Last 24 hours) at 02/07/15 1756 Last data filed at 02/07/15 1309  Gross per 24 hour  Intake 2372.5 ml  Output      0 ml  Net 2372.5 ml   Filed Weights   02/06/15 1400  Weight: 71.2 kg (156 lb 15.5 oz)    Exam:   General:  Alert afebrile comfortable  Cardiovascular: s1s2  Respiratory: ctab  Abdomen: soft non tender non distended bowel sounds heard  Musculoskeletal: bruising over the lower extremities.   Data Reviewed: Basic Metabolic Panel:  Recent Labs Lab 02/06/15 0655 02/06/15 1530  NA 139 140  K 3.2* 4.5  CL 102 107  CO2 24 22  GLUCOSE 145* 149*  BUN 12 9  CREATININE 0.75 0.65  CALCIUM 9.5 8.9  MG  --  2.0   Liver Function Tests:  Recent Labs Lab 02/06/15 0655  AST 33  ALT 21  ALKPHOS 53  BILITOT 0.7  PROT 8.0  ALBUMIN 4.4   No results for input(s): LIPASE, AMYLASE in the last 168 hours. No results for input(s): AMMONIA in the last 168 hours. CBC:  Recent Labs Lab 02/06/15 0655 02/07/15 0610  WBC 13.6* 5.7   NEUTROABS 12.1*  --   HGB 15.2* 13.0  HCT 44.9 40.6  MCV 97.6 99.3  PLT 277 252   Cardiac Enzymes:  Recent Labs Lab 02/06/15 0655 02/07/15 0610  CKTOTAL 457* 694*   BNP (last 3 results) No results for input(s): BNP in the last 8760 hours.  ProBNP (last 3 results) No results for input(s): PROBNP in the last 8760 hours.  CBG: No results for input(s): GLUCAP in the last 168 hours.  No results found for this or any previous visit (from the past 240 hour(s)).   Studies: Dg Ribs Unilateral W/chest Right  02/06/2015  CLINICAL DATA:  Right chest wall pain after a fall. Bruising to the face. Found on the floor at her nursing home. EXAM: RIGHT RIBS AND CHEST - 3+ VIEW COMPARISON:  None. FINDINGS: No convincing rib fracture.  No rib lesion. Deformity of the right humeral head is consistent with old fracture. Bony thorax is diffusely demineralized. Cardiac silhouette is borderline enlarged. No mediastinal or hilar masses or convincing adenopathy. Lungs show prominent bronchovascular markings but no convincing edema or evidence of pneumonia. No pleural effusion or pneumothorax. IMPRESSION: 1. No rib fracture or rib lesion. 2. No acute cardiopulmonary disease. Electronically Signed   By: Amie Portland M.D.   On: 02/06/2015 08:03   Ct Head Wo Contrast  02/06/2015  CLINICAL DATA:  Nurse note: found on floor @ 0520 by staff, pt last seen 0300. Pt is at baseline mental status, hx of dementia. Bruising noted around L eye and swelling to L cheek. Dried blood noted to L nare EXAM: CT HEAD WITHOUT CONTRAST CT MAXILLOFACIAL WITHOUT CONTRAST CT CERVICAL SPINE WITHOUT CONTRAST TECHNIQUE: Multidetector CT imaging of the head, cervical spine, and maxillofacial structures were performed using the standard protocol without intravenous contrast. Multiplanar CT image reconstructions of the cervical spine and maxillofacial structures were also generated. COMPARISON:  05/20/2012 FINDINGS: CT HEAD FINDINGS  Ventricles are normal in configuration. There is ventricular sulcal enlargement reflecting moderate generalized atrophy. No parenchymal masses or mass effect. No evidence of a cortical infarct. Patchy white matter hypoattenuation is noted, stable, consistent with moderate chronic microvascular ischemic change. There are no extra-axial masses or abnormal fluid collections. There is no intracranial hemorrhage. No skull fracture. CT MAXILLOFACIAL FINDINGS Mouth fractures of the left maxillary sinus, along the lateral wall cul mild comminution but no significant displacement. The fracture appears to involve the posterior aspect of the orbital floor without significant depression. Fracture also involves the anterior inferior maxillary sinus wall without significant displacement or depression. No other fractures. There is dependent hemorrhage in the left maxillary sinus. There is left cheek and left preseptal periorbital soft tissue edema/ contusion. Small within cysts are noted consistent with previous bilateral cataract surgery. Globes and orbits are otherwise unremarkable. Remaining sinuses are essentially clear. Clear mastoid air cells and middle ear cavities. No soft tissue masses or adenopathy. CT CERVICAL SPINE FINDINGS No fracture. No spondylolisthesis. There are disc degenerative changes from C3-C4 through C7-T1 with loss of disc height, marked from C4-C5 through C6-C7, and endplate osteophytes. There are facet degenerative changes bilaterally most evident on the right at C2-C3 and C3-C4. Bones are diffusely demineralized. Soft tissues are unremarkable other than carotid vascular calcifications. Scarring is noted in the lung apices. IMPRESSION: HEAD CT:  No acute intracranial abnormalities.  No skull fracture. MAXILLOFACIAL CT: Fractures of the anterior and lateral left maxillary sinus wall without significant displacement. There may be additional nondisplaced fracture of the posterior left orbital floor.  Dependent hemorrhage is noted in the left maxillary sinus. No other fractures. CERVICAL CT:  No fracture or acute finding. Electronically Signed   By: Amie Portland M.D.   On: 02/06/2015 07:38   Ct Cervical Spine Wo Contrast  02/06/2015  CLINICAL DATA:  Nurse note: found on floor @ 0520 by staff, pt last seen 0300. Pt is at baseline mental status, hx of dementia. Bruising noted around L eye and swelling to L cheek. Dried blood noted to L nare EXAM: CT HEAD WITHOUT CONTRAST CT MAXILLOFACIAL WITHOUT CONTRAST CT CERVICAL SPINE WITHOUT CONTRAST TECHNIQUE: Multidetector CT imaging of the head, cervical spine, and maxillofacial structures were performed using the standard protocol without intravenous contrast. Multiplanar CT image reconstructions of the cervical spine and maxillofacial structures were also generated. COMPARISON:  05/20/2012 FINDINGS: CT HEAD FINDINGS Ventricles are normal in configuration. There is ventricular sulcal enlargement reflecting moderate generalized atrophy. No parenchymal masses or mass effect. No evidence of a cortical infarct. Patchy white matter hypoattenuation is noted, stable, consistent with moderate chronic microvascular ischemic change. There are no extra-axial masses or abnormal fluid collections. There is no intracranial hemorrhage. No skull fracture. CT MAXILLOFACIAL FINDINGS Mouth fractures of the left maxillary sinus, along the lateral wall cul mild comminution but no significant displacement. The fracture appears to involve the posterior aspect of the orbital floor without significant  depression. Fracture also involves the anterior inferior maxillary sinus wall without significant displacement or depression. No other fractures. There is dependent hemorrhage in the left maxillary sinus. There is left cheek and left preseptal periorbital soft tissue edema/ contusion. Small within cysts are noted consistent with previous bilateral cataract surgery. Globes and orbits are  otherwise unremarkable. Remaining sinuses are essentially clear. Clear mastoid air cells and middle ear cavities. No soft tissue masses or adenopathy. CT CERVICAL SPINE FINDINGS No fracture. No spondylolisthesis. There are disc degenerative changes from C3-C4 through C7-T1 with loss of disc height, marked from C4-C5 through C6-C7, and endplate osteophytes. There are facet degenerative changes bilaterally most evident on the right at C2-C3 and C3-C4. Bones are diffusely demineralized. Soft tissues are unremarkable other than carotid vascular calcifications. Scarring is noted in the lung apices. IMPRESSION: HEAD CT:  No acute intracranial abnormalities.  No skull fracture. MAXILLOFACIAL CT: Fractures of the anterior and lateral left maxillary sinus wall without significant displacement. There may be additional nondisplaced fracture of the posterior left orbital floor. Dependent hemorrhage is noted in the left maxillary sinus. No other fractures. CERVICAL CT:  No fracture or acute finding. Electronically Signed   By: Amie Portlandavid  Ormond M.D.   On: 02/06/2015 07:38   Dg Knee Complete 4 Views Left  02/06/2015  CLINICAL DATA:  Bruising, swelling, pain to anterior knee after fall. Unwitnessed fall. EXAM: LEFT KNEE - COMPLETE 4+ VIEW COMPARISON:  None. FINDINGS: There is mild tricompartmental degenerative joint space narrowing. No large osteophytes or other secondary signs of advanced degenerative osteoarthritis. Osseous alignment is otherwise normal. No fracture line or displaced fracture fragment identified. No acute- appearing cortical irregularity or osseous lesion. No appreciable joint effusion. Scattered atherosclerotic changes noted within the soft tissues posterior to the left knee. Superficial soft tissues otherwise unremarkable. IMPRESSION: No acute findings.  No osseous fracture or dislocation. Electronically Signed   By: Bary RichardStan  Maynard M.D.   On: 02/06/2015 18:12   Ct Maxillofacial Wo Cm  02/06/2015  CLINICAL  DATA:  Nurse note: found on floor @ 0520 by staff, pt last seen 0300. Pt is at baseline mental status, hx of dementia. Bruising noted around L eye and swelling to L cheek. Dried blood noted to L nare EXAM: CT HEAD WITHOUT CONTRAST CT MAXILLOFACIAL WITHOUT CONTRAST CT CERVICAL SPINE WITHOUT CONTRAST TECHNIQUE: Multidetector CT imaging of the head, cervical spine, and maxillofacial structures were performed using the standard protocol without intravenous contrast. Multiplanar CT image reconstructions of the cervical spine and maxillofacial structures were also generated. COMPARISON:  05/20/2012 FINDINGS: CT HEAD FINDINGS Ventricles are normal in configuration. There is ventricular sulcal enlargement reflecting moderate generalized atrophy. No parenchymal masses or mass effect. No evidence of a cortical infarct. Patchy white matter hypoattenuation is noted, stable, consistent with moderate chronic microvascular ischemic change. There are no extra-axial masses or abnormal fluid collections. There is no intracranial hemorrhage. No skull fracture. CT MAXILLOFACIAL FINDINGS Mouth fractures of the left maxillary sinus, along the lateral wall cul mild comminution but no significant displacement. The fracture appears to involve the posterior aspect of the orbital floor without significant depression. Fracture also involves the anterior inferior maxillary sinus wall without significant displacement or depression. No other fractures. There is dependent hemorrhage in the left maxillary sinus. There is left cheek and left preseptal periorbital soft tissue edema/ contusion. Small within cysts are noted consistent with previous bilateral cataract surgery. Globes and orbits are otherwise unremarkable. Remaining sinuses are essentially clear. Clear mastoid air cells and  middle ear cavities. No soft tissue masses or adenopathy. CT CERVICAL SPINE FINDINGS No fracture. No spondylolisthesis. There are disc degenerative changes from C3-C4  through C7-T1 with loss of disc height, marked from C4-C5 through C6-C7, and endplate osteophytes. There are facet degenerative changes bilaterally most evident on the right at C2-C3 and C3-C4. Bones are diffusely demineralized. Soft tissues are unremarkable other than carotid vascular calcifications. Scarring is noted in the lung apices. IMPRESSION: HEAD CT:  No acute intracranial abnormalities.  No skull fracture. MAXILLOFACIAL CT: Fractures of the anterior and lateral left maxillary sinus wall without significant displacement. There may be additional nondisplaced fracture of the posterior left orbital floor. Dependent hemorrhage is noted in the left maxillary sinus. No other fractures. CERVICAL CT:  No fracture or acute finding. Electronically Signed   By: Amie Portland M.D.   On: 02/06/2015 07:38    Scheduled Meds: . amLODipine  5 mg Oral Daily  . cefTRIAXone (ROCEPHIN)  IV  1 g Intravenous Q24H  . donepezil  10 mg Oral QHS  . enoxaparin (LOVENOX) injection  40 mg Subcutaneous Q24H  . levothyroxine  50 mcg Oral QAC breakfast  . LORazepam  0-4 mg Intravenous 4 times per day  . LORazepam  0-4 mg Intravenous Q12H  . LORazepam  0-4 mg Oral 4 times per day  . LORazepam  0-4 mg Oral Q12H  . nortriptyline  10 mg Oral QHS  . pantoprazole  40 mg Oral Daily  . potassium chloride  20 mEq Oral BID  . simvastatin  20 mg Oral QHS  . thiamine  100 mg Intravenous Daily  . thiamine  100 mg Oral Daily   Continuous Infusions: . sodium chloride 75 mL/hr at 02/06/15 1728    Active Problems:   Lactic acidosis   UTI (lower urinary tract infection)    Time spent: 25 minutes    [facility]  [facility] Pager [phone]. If 7PM-7AM, please contact night-coverage at [email or web address], password [facility] 02/07/2015, 5:56 PM  LOS: 1 day

## 2015-02-08 LAB — URINALYSIS, ROUTINE W REFLEX MICROSCOPIC
Bilirubin Urine: NEGATIVE
Glucose, UA: NEGATIVE mg/dL
HGB URINE DIPSTICK: NEGATIVE
Ketones, ur: NEGATIVE mg/dL
Leukocytes, UA: NEGATIVE
Nitrite: NEGATIVE
Protein, ur: NEGATIVE mg/dL
SPECIFIC GRAVITY, URINE: 1.016 (ref 1.005–1.030)
UROBILINOGEN UA: 0.2 mg/dL (ref 0.0–1.0)
pH: 6 (ref 5.0–8.0)

## 2015-02-08 LAB — URINE CULTURE

## 2015-02-08 NOTE — Progress Notes (Signed)
[facility] PROGRESS NOTE  Memphis Creswell WUJ:[id number] DOB: 08-15-1921 DOA: 02/06/2015 PCP: No primary care provider on file.  Assessment/Plan: Fall with elevated CK level/ lactic acidosis: Possibly from dehydration and UTI. Gentle hydration and repeat CK shows mild elevation. Continue with fluid resuscitation and repeat CK in am.   Lactic acid is normal on fluid resuscitation.   Started her on IV rocephin, urine cultures grew multiple bacteria, repeat UA ordered.  and physical therapy eval.    Hypertension: Controlled.    Left knee pain: Bruise with some swelling surrounding it.  X ray of the knee ordered, does not show any fractures or fluid.   Fractures of the left maxillary sinus wall and posterior left orbital floor with out any displacement: Pain control.   Code Status: DNR.  Family Communication: none at bedside Disposition Plan: pending PT eval. back to [facility] on tuesday.    Consultants:  none  Procedures:  none  Antibiotics:  Rocephin   HPI/Subjective: No new complaints.   Objective: Filed Vitals:   02/08/15 0644  BP: 144/57  Pulse: 92  Temp: 98.5 F (36.9 C)  Resp: 20    Intake/Output Summary (Last 24 hours) at 02/08/15 1417 Last data filed at 02/08/15 1000  Gross per 24 hour  Intake    880 ml  Output      0 ml  Net    880 ml   Filed Weights   02/06/15 1400  Weight: 71.2 kg (156 lb 15.5 oz)    Exam:   General:  Alert afebrile comfortable  Cardiovascular: s1s2  Respiratory: ctab  Abdomen: soft non tender non distended bowel sounds heard  Musculoskeletal: bruising over the lower extremities.   Data Reviewed: Basic Metabolic Panel:  Recent Labs Lab 02/06/15 0655 02/06/15 1530  NA 139 140  K 3.2* 4.5  CL 102 107  CO2 24 22  GLUCOSE 145* 149*  BUN 12 9  CREATININE 0.75 0.65  CALCIUM 9.5 8.9  MG  --  2.0   Liver Function Tests:  Recent Labs Lab 02/06/15 0655  AST 33  ALT 21  ALKPHOS 53   BILITOT 0.7  PROT 8.0  ALBUMIN 4.4   No results for input(s): LIPASE, AMYLASE in the last 168 hours. No results for input(s): AMMONIA in the last 168 hours. CBC:  Recent Labs Lab 02/06/15 0655 02/07/15 0610  WBC 13.6* 5.7  NEUTROABS 12.1*  --   HGB 15.2* 13.0  HCT 44.9 40.6  MCV 97.6 99.3  PLT 277 252   Cardiac Enzymes:  Recent Labs Lab 02/06/15 0655 02/07/15 0610  CKTOTAL 457* 694*   BNP (last 3 results) No results for input(s): BNP in the last 8760 hours.  ProBNP (last 3 results) No results for input(s): PROBNP in the last 8760 hours.  CBG: No results for input(s): GLUCAP in the last 168 hours.  Recent Results (from the past 240 hour(s))  Culture, Urine     Status: None   Collection Time: 02/06/15  8:30 AM  Result Value Ref Range Status   Specimen Description URINE, RANDOM  Final   Special Requests NONE  Final   Culture   Final    MULTIPLE SPECIES PRESENT, SUGGEST RECOLLECTION Performed at [facility]    Report Status 02/08/2015 FINAL  Final     Studies: Dg Knee Complete 4 Views Left  02/06/2015  CLINICAL DATA:  Bruising, swelling, pain to anterior knee after fall. Unwitnessed fall. EXAM: LEFT KNEE - COMPLETE 4+ VIEW  COMPARISON:  None. FINDINGS: There is mild tricompartmental degenerative joint space narrowing. No large osteophytes or other secondary signs of advanced degenerative osteoarthritis. Osseous alignment is otherwise normal. No fracture line or displaced fracture fragment identified. No acute- appearing cortical irregularity or osseous lesion. No appreciable joint effusion. Scattered atherosclerotic changes noted within the soft tissues posterior to the left knee. Superficial soft tissues otherwise unremarkable. IMPRESSION: No acute findings.  No osseous fracture or dislocation. Electronically Signed   By: Bary RichardStan  Maynard M.D.   On: 02/06/2015 18:12    Scheduled Meds: . amLODipine  5 mg Oral Daily  . cefTRIAXone (ROCEPHIN)  IV  1 g  Intravenous Q24H  . donepezil  10 mg Oral QHS  . enoxaparin (LOVENOX) injection  40 mg Subcutaneous Q24H  . levothyroxine  50 mcg Oral QAC breakfast  . LORazepam  0-4 mg Oral Q12H  . nortriptyline  10 mg Oral QHS  . pantoprazole  40 mg Oral Daily  . potassium chloride  20 mEq Oral BID  . simvastatin  20 mg Oral QHS  . thiamine  100 mg Oral Daily   Continuous Infusions: . sodium chloride 75 mL/hr at 02/06/15 1728    Active Problems:   Lactic acidosis   UTI (lower urinary tract infection)    Time spent: 25 minutes    [facility] HospitalKULA,Nikol Lemar  [facility] Pager [phone]. If 7PM-7AM, please contact night-coverage at [email or web address], password [email or web address] 02/08/2015, 2:17 PM  LOS: 2 days

## 2015-02-08 NOTE — Evaluation (Signed)
Physical Therapy Evaluation Patient Details Name: Frances Jackson MRN: [id number] DOB: 1921-04-26 Today's Date: 02/08/2015   History of Present Illness  Frances Jackson is a 81 y.o. female WITH prior h/o hypertension, dementia, anxiety, arthritis, presents to [place] ED 02/06/15 with a fall. She was found to have a left bruised eye, bruised left knee and doesn't remember how she ended up on the floor. She is baseline confused and  Has dementia  Clinical Impression  Pt admitted with above diagnosis. Pt currently with functional limitations due to the deficits listed below (see PT Problem List). Pt will benefit from skilled PT to increase their independence and safety with mobility to allow discharge to the venue listed below.  Patient pleasantly confused. Did participate in mobility.     Follow Up Recommendations SNF;Supervision/Assistance - 24 hour    Equipment Recommendations  None recommended by PT    Recommendations for Other Services       Precautions / Restrictions Precautions Precautions: Fall      Mobility  Bed Mobility Overal bed mobility: Needs Assistance Bed Mobility: Supine to Sit;Sit to Supine     Supine to sit: Min guard Sit to supine: Min assist   General bed mobility comments: assist with legs onto bed.  Transfers Overall transfer level: Needs assistance Equipment used: 1 person hand held assist Transfers: Sit to/from Stand Sit to Stand: Mod assist         General transfer comment: assist   for standing, steady assist, HHA, wide base.. $ side steps along the bed  with 1 HHA, decreased weight shift  Ambulation/Gait                Stairs            Wheelchair Mobility    Modified Rankin (Stroke Patients Only)       Balance Overall balance assessment: History of Falls;Needs assistance Sitting-balance support: No upper extremity supported;Feet supported Sitting balance-Leahy Scale: Fair     Standing balance support:  Single extremity supported;During functional activity Standing balance-Leahy Scale: Poor                               Pertinent Vitals/Pain Pain Assessment: No/denies pain    Home Living Family/patient expects to be discharged to:: Skilled nursing facility                 Additional Comments: resided in ALF    Prior Function           Comments: uncertain of assistance, patient unable to state,     Hand Dominance   Dominant Hand: Left    Extremity/Trunk Assessment   Upper Extremity Assessment: RUE deficits/detail 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,         Lower Extremity Assessment: Generalized weakness         Communication      Cognition Arousal/Alertness: Awake/alert Behavior During Therapy: WFL for tasks assessed/performed Overall Cognitive Status: No family/caregiver present to determine baseline cognitive functioning Area of Impairment: Orientation     Memory: Decreased short-term memory         General Comments: did state , generally stated"whatever you say"    General Comments      Exercises        Assessment/Plan    PT Assessment Patient needs continued PT services  PT Diagnosis Difficulty walking;Generalized weakness;Altered mental status   PT Problem List  Decreased activity tolerance;Decreased balance;Decreased mobility;Decreased cognition;Decreased safety awareness;Decreased knowledge of precautions  PT Treatment Interventions DME instruction;Functional mobility training;Therapeutic activities;Therapeutic exercise;Balance training;Patient/family education   PT Goals (Current goals can be found in the Care Plan section) Acute Rehab PT Goals Patient Stated Goal: agreed with standing PT Goal Formulation: Patient unable to participate in goal setting Time For Goal Achievement: 02/22/15 Potential to Achieve Goals: Good    Frequency Min 3X/week   Barriers to discharge         Co-evaluation               End of Session Equipment Utilized During Treatment: Gait belt Activity Tolerance: Patient tolerated treatment well Patient left: in bed;with call bell/phone within reach;with bed alarm set Nurse Communication: Mobility status         Time: [id number] PT Time Calculation (min) (ACUTE ONLY): 13 min   Charges:   PT Evaluation $Initial PT Evaluation Tier I: 1 Procedure     PT G CodesRada Hay:        Frances Ambrosino Elizabeth 02/08/2015, 4:07 PM Blanchard KelchKaren Joyleen Haselton PT [phone]

## 2015-02-09 LAB — CK: Total CK: 274 U/L — ABNORMAL HIGH (ref 38–234)

## 2015-02-09 NOTE — Care Management Important Message (Signed)
Important Message  Patient Details  Name: Newton PiggMary Ann Kube MRN: [id number] Date of Birth: 08/14/1921   Medicare Important Message Given:  Yes    Haskell FlirtJamison, Tychelle Purkey 02/09/2015, 11:07 AMImportant Message  Patient Details  Name: Newton PiggMary Ann Chisolm MRN: [id number] Date of Birth: 12/19/1921   Medicare Important Message Given:  Yes    Haskell FlirtJamison, Delane Stalling 02/09/2015, 11:07 AM

## 2015-02-09 NOTE — Progress Notes (Signed)
CSW attempted to get in contact with daughter Lupita LeashDonna to inform of patient transfer to facility. CSW left voice message at 4:49pm.  CSW contact grand-daughter Annice PihJackie, who informed CSW that the family will be transporting the patient back to the facility. ETA is 5:30PM CSW to inform unit RN. No further CSW needs requested at this time. CSW to sign off.   Fernande BoydenJoyce [place], [place] DayCSWA Clinical Social Worker [facility] [phone]

## 2015-02-09 NOTE — Progress Notes (Signed)
Gave report to Shanda BumpsJessica, [facility] fundraiserN [facility]. Left number if she had additional questions.

## 2015-02-09 NOTE — [place] FL2 (Signed)
[place] MEDICAID FL2 LEVEL OF CARE SCREENING TOOL     IDENTIFICATION  Patient Name: Frances Jackson Birthdate: [birth year] Sex: female Admission Date (Current Location): 02/06/2015  [facility] and [place] Number: Dispensing optician and Address:  [facility],  [address] [place] [address]      Provider Number: [id number]  Attending Physician Name and Address:  Kathlen Mody, MD  Relative Name and Phone Number:       Current Level of Care: Hospital Recommended Level of Care: Assisted Living Facility Prior Approval Number:    Date Approved/Denied:   PASRR Number:    Discharge Plan: Other (Comment)    Current Diagnoses: Patient Active Problem List   Diagnosis Date Noted  . Lactic acidosis 02/06/2015  . UTI (lower urinary tract infection) 02/06/2015    Orientation ACTIVITIES/SOCIAL BLADDER RESPIRATION    Self, Place  Active Continent Normal  BEHAVIORAL SYMPTOMS/MOOD NEUROLOGICAL BOWEL NUTRITION STATUS      Continent Diet  PHYSICIAN VISITS COMMUNICATION OF NEEDS Height & Weight Skin    Verbally  (162.6 cm) 156 lbs. Normal          AMBULATORY STATUS RESPIRATION    Supervision limited Normal      Personal Care Assistance Level of Assistance  Bathing, Feeding, Dressing Bathing Assistance: Limited assistance Feeding assistance: Independent Dressing Assistance: Limited assistance      Functional Limitations Info  Sight, Hearing, Speech Sight Info: Adequate Hearing Info: Impaired Speech Info: Adequate       SPECIAL CARE FACTORS FREQUENCY  PT (By licensed PT), OT (By licensed OT)                   Additional Factors Info  Code Status, Allergies, Psychotropic Code Status Info: DNR Allergies Info: No Known Allergies  Psychotropic Info: Ativan         Current Medications (02/09/2015): Current Facility-Administered Medications  Medication Dose Route Frequency Provider Last Rate Last Dose  . 0.9 %  sodium chloride  infusion   Intravenous Continuous Kathlen Mody, MD 75 mL/hr at 02/06/15 1728    . acetaminophen (TYLENOL) tablet 500 mg  500 mg Oral Q12H PRN Kathlen Mody, MD      . amLODipine (NORVASC) tablet 5 mg  5 mg Oral Daily Kathlen Mody, MD   5 mg at 02/09/15 1018  . cefTRIAXone (ROCEPHIN) 1 g in dextrose 5 % 50 mL IVPB  1 g Intravenous Q24H Kathlen Mody, MD   1 g at 02/09/15 1018  . clonazePAM (KLONOPIN) tablet 0.5 mg  0.5 mg Oral BID PRN Kathlen Mody, MD   0.5 mg at 02/07/15 2114  . donepezil (ARICEPT) tablet 10 mg  10 mg Oral QHS Kathlen Mody, MD   10 mg at 02/08/15 2106  . enoxaparin (LOVENOX) injection 40 mg  40 mg Subcutaneous Q24H Kathlen Mody, MD   40 mg at 02/08/15 2106  . levothyroxine (SYNTHROID, LEVOTHROID) tablet 50 mcg  50 mcg Oral QAC breakfast Kathlen Mody, MD   50 mcg at 02/09/15 4696  . LORazepam (ATIVAN) tablet 0-4 mg  0-4 mg Oral Q12H Leta Baptist, MD   1 mg at 02/09/15 1150  . nortriptyline (PAMELOR) capsule 10 mg  10 mg Oral QHS Kathlen Mody, MD   10 mg at 02/08/15 2106  . pantoprazole (PROTONIX) EC tablet 40 mg  40 mg Oral Daily Kathlen Mody, MD   40 mg at 02/09/15 1019  . potassium chloride SA (K-DUR,KLOR-CON) CR tablet 20 mEq  20 mEq Oral BID Leta BaptistEmily Roe Nguyen, MD   20 mEq at 02/09/15 1019  . simvastatin (ZOCOR) tablet 20 mg  20 mg Oral QHS Kathlen ModyVijaya Akula, MD   20 mg at 02/08/15 2106  . thiamine (VITAMIN B-1) tablet 100 mg  100 mg Oral Daily Leta BaptistEmily Roe Nguyen, MD   100 mg at 02/09/15 1019   Do not use this list as official medication orders. Please verify with discharge summary.  Discharge Medications:   Medication List    TAKE these medications        acetaminophen 325 MG tablet  Commonly known as:  TYLENOL  Take 650 mg by mouth 3 (three) times daily.     acetaminophen 500 MG tablet  Commonly known as:  TYLENOL  Take 500 mg by mouth every 12 (twelve) hours as needed for mild pain.     amLODipine 5 MG tablet  Commonly known as:  NORVASC  Take 5 mg by mouth daily.      clonazePAM 0.5 MG tablet  Commonly known as:  KLONOPIN  Take 0.5 mg by mouth 2 (two) times daily as needed for anxiety.     Cranberry 425 MG Caps  Take 1 capsule by mouth daily.     donepezil 10 MG tablet  Commonly known as:  ARICEPT  Take 10 mg by mouth at bedtime.     furosemide 20 MG tablet  Commonly known as:  LASIX  Take 20 mg by mouth daily.     levothyroxine 50 MCG tablet  Commonly known as:  SYNTHROID, LEVOTHROID  Take 50 mcg by mouth daily before breakfast.     mineral oil external liquid  Place 2 drops into the right ear once a week.     NON FORMULARY  Take 6 oz by mouth 2 (two) times daily. 6 oz of wine.     nortriptyline 10 MG capsule  Commonly known as:  PAMELOR  Take 10 mg by mouth at bedtime.     omeprazole 20 MG capsule  Commonly known as:  PRILOSEC  Take 20 mg by mouth 2 (two) times daily.     simvastatin 20 MG tablet  Commonly known as:  ZOCOR  Take 20 mg by mouth at bedtime.        Relevant Imaging Results:  Relevant Lab Results:  Recent Labs    Additional Information    Frances DickerJoyce S Cruze Zingaro, LCSW

## 2015-02-09 NOTE — Discharge Summary (Signed)
Physician Discharge Summary  Frances Jackson OZH:[id number] DOB: [birth year] DOA: 02/06/2015  PCP: No primary care provider on file.  Admit date: 02/06/2015 Discharge date: 02/09/2015  Time spent: 30 minutes  Recommendations for Outpatient Follow-up:  Follow up with physical therapy as recommended.  Please follow up with PCP in one week post hospitalization visit.    Discharge Diagnoses:  Active Problems:   Lactic acidosis   UTI (lower urinary tract infection)   Discharge Condition: improved  Diet recommendation: regular  Filed Weights   02/06/15 1400  Weight: 71.2 kg (156 lb 15.5 oz)    History of present illness:   Frances Jackson is a 81 y.o. female WITH prior h/o hypertension, dementia, anxiety, arthritis, presents to [facility] ED with a fall. She was found to be hypokalemic, mild rhabdomyolysis and a UTI.   Hospital Course:  Fall with elevated CK level/ lactic acidosis: Possibly from dehydration and UTI. Gentle hydration and repeat CK much improvement. Lactic acid is normal on fluid resuscitation.  Started her on IV rocephin, urine cultures grew multiple bacteria, repeat UA ordered is negative.  Physical therapy recommending SNF.   Hypertension: Controlled.    Left knee pain: Bruise with some swelling surrounding it.  X ray of the knee ordered, does not show any fractures or fluid.   Fractures of the left maxillary sinus wall and posterior left orbital floor with out any displacement: Pain control. FOLLOW UP WITH PCP ino ne week .   Procedures:  none  Consultations:  none  Discharge Exam: Filed Vitals:   02/09/15 0551  BP: 122/47  Pulse: 88  Temp: 98.4 F (36.9 C)  Resp: 20    General: alert and sitting in the chair Cardiovascular: s1s2 Respiratory: ctab  Discharge Instructions    Current Discharge Medication List    CONTINUE these medications which have NOT CHANGED   Details  !! acetaminophen (TYLENOL) 325 MG tablet Take  650 mg by mouth 3 (three) times daily.    !! acetaminophen (TYLENOL) 500 MG tablet Take 500 mg by mouth every 12 (twelve) hours as needed for mild pain.    amLODipine (NORVASC) 5 MG tablet Take 5 mg by mouth daily.    clonazePAM (KLONOPIN) 0.5 MG tablet Take 0.5 mg by mouth 2 (two) times daily as needed for anxiety.    Cranberry 425 MG CAPS Take 1 capsule by mouth daily.    donepezil (ARICEPT) 10 MG tablet Take 10 mg by mouth at bedtime.     furosemide (LASIX) 20 MG tablet Take 20 mg by mouth daily.     levothyroxine (SYNTHROID, LEVOTHROID) 50 MCG tablet Take 50 mcg by mouth daily before breakfast.    mineral oil external liquid Place 2 drops into the right ear once a week.    NON FORMULARY Take 6 oz by mouth 2 (two) times daily. 6 oz of wine.    nortriptyline (PAMELOR) 10 MG capsule Take 10 mg by mouth at bedtime.    omeprazole (PRILOSEC) 20 MG capsule Take 20 mg by mouth 2 (two) times daily.    simvastatin (ZOCOR) 20 MG tablet Take 20 mg by mouth at bedtime.     !! - Potential duplicate medications found. Please discuss with provider.     No Known Allergies    The results of significant diagnostics from this hospitalization (including imaging, microbiology, ancillary and laboratory) are listed below for reference.    Significant Diagnostic Studies: Dg Ribs Unilateral W/chest Right  02/06/2015  CLINICAL DATA:  Right chest wall pain after a fall. Bruising to the face. Found on the floor at her nursing home. EXAM: RIGHT RIBS AND CHEST - 3+ VIEW COMPARISON:  None. FINDINGS: No convincing rib fracture.  No rib lesion. Deformity of the right humeral head is consistent with old fracture. Bony thorax is diffusely demineralized. Cardiac silhouette is borderline enlarged. No mediastinal or hilar masses or convincing adenopathy. Lungs show prominent bronchovascular markings but no convincing edema or evidence of pneumonia. No pleural effusion or pneumothorax. IMPRESSION: 1. No rib  fracture or rib lesion. 2. No acute cardiopulmonary disease. Electronically Signed   By: Amie Portlandavid  Ormond M.D.   On: 02/06/2015 08:03   Ct Head Wo Contrast  02/06/2015  CLINICAL DATA:  Nurse note: found on floor @ 0520 by staff, pt last seen 0300. Pt is at baseline mental status, hx of dementia. Bruising noted around L eye and swelling to L cheek. Dried blood noted to L nare EXAM: CT HEAD WITHOUT CONTRAST CT MAXILLOFACIAL WITHOUT CONTRAST CT CERVICAL SPINE WITHOUT CONTRAST TECHNIQUE: Multidetector CT imaging of the head, cervical spine, and maxillofacial structures were performed using the standard protocol without intravenous contrast. Multiplanar CT image reconstructions of the cervical spine and maxillofacial structures were also generated. COMPARISON:  05/20/2012 FINDINGS: CT HEAD FINDINGS Ventricles are normal in configuration. There is ventricular sulcal enlargement reflecting moderate generalized atrophy. No parenchymal masses or mass effect. No evidence of a cortical infarct. Patchy white matter hypoattenuation is noted, stable, consistent with moderate chronic microvascular ischemic change. There are no extra-axial masses or abnormal fluid collections. There is no intracranial hemorrhage. No skull fracture. CT MAXILLOFACIAL FINDINGS Mouth fractures of the left maxillary sinus, along the lateral wall cul mild comminution but no significant displacement. The fracture appears to involve the posterior aspect of the orbital floor without significant depression. Fracture also involves the anterior inferior maxillary sinus wall without significant displacement or depression. No other fractures. There is dependent hemorrhage in the left maxillary sinus. There is left cheek and left preseptal periorbital soft tissue edema/ contusion. Small within cysts are noted consistent with previous bilateral cataract surgery. Globes and orbits are otherwise unremarkable. Remaining sinuses are essentially clear. Clear mastoid  air cells and middle ear cavities. No soft tissue masses or adenopathy. CT CERVICAL SPINE FINDINGS No fracture. No spondylolisthesis. There are disc degenerative changes from C3-C4 through C7-T1 with loss of disc height, marked from C4-C5 through C6-C7, and endplate osteophytes. There are facet degenerative changes bilaterally most evident on the right at C2-C3 and C3-C4. Bones are diffusely demineralized. Soft tissues are unremarkable other than carotid vascular calcifications. Scarring is noted in the lung apices. IMPRESSION: HEAD CT:  No acute intracranial abnormalities.  No skull fracture. MAXILLOFACIAL CT: Fractures of the anterior and lateral left maxillary sinus wall without significant displacement. There may be additional nondisplaced fracture of the posterior left orbital floor. Dependent hemorrhage is noted in the left maxillary sinus. No other fractures. CERVICAL CT:  No fracture or acute finding. Electronically Signed   By: Amie Portlandavid  Ormond M.D.   On: 02/06/2015 07:38   Ct Cervical Spine Wo Contrast  02/06/2015  CLINICAL DATA:  Nurse note: found on floor @ 0520 by staff, pt last seen 0300. Pt is at baseline mental status, hx of dementia. Bruising noted around L eye and swelling to L cheek. Dried blood noted to L nare EXAM: CT HEAD WITHOUT CONTRAST CT MAXILLOFACIAL WITHOUT CONTRAST CT CERVICAL SPINE WITHOUT CONTRAST TECHNIQUE: Multidetector CT imaging of the head, cervical spine,  and maxillofacial structures were performed using the standard protocol without intravenous contrast. Multiplanar CT image reconstructions of the cervical spine and maxillofacial structures were also generated. COMPARISON:  05/20/2012 FINDINGS: CT HEAD FINDINGS Ventricles are normal in configuration. There is ventricular sulcal enlargement reflecting moderate generalized atrophy. No parenchymal masses or mass effect. No evidence of a cortical infarct. Patchy white matter hypoattenuation is noted, stable, consistent with  moderate chronic microvascular ischemic change. There are no extra-axial masses or abnormal fluid collections. There is no intracranial hemorrhage. No skull fracture. CT MAXILLOFACIAL FINDINGS Mouth fractures of the left maxillary sinus, along the lateral wall cul mild comminution but no significant displacement. The fracture appears to involve the posterior aspect of the orbital floor without significant depression. Fracture also involves the anterior inferior maxillary sinus wall without significant displacement or depression. No other fractures. There is dependent hemorrhage in the left maxillary sinus. There is left cheek and left preseptal periorbital soft tissue edema/ contusion. Small within cysts are noted consistent with previous bilateral cataract surgery. Globes and orbits are otherwise unremarkable. Remaining sinuses are essentially clear. Clear mastoid air cells and middle ear cavities. No soft tissue masses or adenopathy. CT CERVICAL SPINE FINDINGS No fracture. No spondylolisthesis. There are disc degenerative changes from C3-C4 through C7-T1 with loss of disc height, marked from C4-C5 through C6-C7, and endplate osteophytes. There are facet degenerative changes bilaterally most evident on the right at C2-C3 and C3-C4. Bones are diffusely demineralized. Soft tissues are unremarkable other than carotid vascular calcifications. Scarring is noted in the lung apices. IMPRESSION: HEAD CT:  No acute intracranial abnormalities.  No skull fracture. MAXILLOFACIAL CT: Fractures of the anterior and lateral left maxillary sinus wall without significant displacement. There may be additional nondisplaced fracture of the posterior left orbital floor. Dependent hemorrhage is noted in the left maxillary sinus. No other fractures. CERVICAL CT:  No fracture or acute finding. Electronically Signed   By: Amie Portland M.D.   On: 02/06/2015 07:38   Dg Knee Complete 4 Views Left  02/06/2015  CLINICAL DATA:  Bruising,  swelling, pain to anterior knee after fall. Unwitnessed fall. EXAM: LEFT KNEE - COMPLETE 4+ VIEW COMPARISON:  None. FINDINGS: There is mild tricompartmental degenerative joint space narrowing. No large osteophytes or other secondary signs of advanced degenerative osteoarthritis. Osseous alignment is otherwise normal. No fracture line or displaced fracture fragment identified. No acute- appearing cortical irregularity or osseous lesion. No appreciable joint effusion. Scattered atherosclerotic changes noted within the soft tissues posterior to the left knee. Superficial soft tissues otherwise unremarkable. IMPRESSION: No acute findings.  No osseous fracture or dislocation. Electronically Signed   By: Bary Richard M.D.   On: 02/06/2015 18:12   Ct Maxillofacial Wo Cm  02/06/2015  CLINICAL DATA:  Nurse note: found on floor @ 0520 by staff, pt last seen 0300. Pt is at baseline mental status, hx of dementia. Bruising noted around L eye and swelling to L cheek. Dried blood noted to L nare EXAM: CT HEAD WITHOUT CONTRAST CT MAXILLOFACIAL WITHOUT CONTRAST CT CERVICAL SPINE WITHOUT CONTRAST TECHNIQUE: Multidetector CT imaging of the head, cervical spine, and maxillofacial structures were performed using the standard protocol without intravenous contrast. Multiplanar CT image reconstructions of the cervical spine and maxillofacial structures were also generated. COMPARISON:  05/20/2012 FINDINGS: CT HEAD FINDINGS Ventricles are normal in configuration. There is ventricular sulcal enlargement reflecting moderate generalized atrophy. No parenchymal masses or mass effect. No evidence of a cortical infarct. Patchy white matter hypoattenuation is noted, stable,  consistent with moderate chronic microvascular ischemic change. There are no extra-axial masses or abnormal fluid collections. There is no intracranial hemorrhage. No skull fracture. CT MAXILLOFACIAL FINDINGS Mouth fractures of the left maxillary sinus, along the lateral  wall cul mild comminution but no significant displacement. The fracture appears to involve the posterior aspect of the orbital floor without significant depression. Fracture also involves the anterior inferior maxillary sinus wall without significant displacement or depression. No other fractures. There is dependent hemorrhage in the left maxillary sinus. There is left cheek and left preseptal periorbital soft tissue edema/ contusion. Small within cysts are noted consistent with previous bilateral cataract surgery. Globes and orbits are otherwise unremarkable. Remaining sinuses are essentially clear. Clear mastoid air cells and middle ear cavities. No soft tissue masses or adenopathy. CT CERVICAL SPINE FINDINGS No fracture. No spondylolisthesis. There are disc degenerative changes from C3-C4 through C7-T1 with loss of disc height, marked from C4-C5 through C6-C7, and endplate osteophytes. There are facet degenerative changes bilaterally most evident on the right at C2-C3 and C3-C4. Bones are diffusely demineralized. Soft tissues are unremarkable other than carotid vascular calcifications. Scarring is noted in the lung apices. IMPRESSION: HEAD CT:  No acute intracranial abnormalities.  No skull fracture. MAXILLOFACIAL CT: Fractures of the anterior and lateral left maxillary sinus wall without significant displacement. There may be additional nondisplaced fracture of the posterior left orbital floor. Dependent hemorrhage is noted in the left maxillary sinus. No other fractures. CERVICAL CT:  No fracture or acute finding. Electronically Signed   By: Amie Portland M.D.   On: 02/06/2015 07:38    Microbiology: Recent Results (from the past 240 hour(s))  Culture, Urine     Status: None   Collection Time: 02/06/15  8:30 AM  Result Value Ref Range Status   Specimen Description URINE, RANDOM  Final   Special Requests NONE  Final   Culture   Final    MULTIPLE SPECIES PRESENT, SUGGEST RECOLLECTION Performed at [facility]    Report Status 02/08/2015 FINAL  Final     Labs: Basic Metabolic Panel:  Recent Labs Lab 02/06/15 0655 02/06/15 1530  NA 139 140  K 3.2* 4.5  CL 102 107  CO2 24 22  GLUCOSE 145* 149*  BUN 12 9  CREATININE 0.75 0.65  CALCIUM 9.5 8.9  MG  --  2.0   Liver Function Tests:  Recent Labs Lab 02/06/15 0655  AST 33  ALT 21  ALKPHOS 53  BILITOT 0.7  PROT 8.0  ALBUMIN 4.4   No results for input(s): LIPASE, AMYLASE in the last 168 hours. No results for input(s): AMMONIA in the last 168 hours. CBC:  Recent Labs Lab 02/06/15 0655 02/07/15 0610  WBC 13.6* 5.7  NEUTROABS 12.1*  --   HGB 15.2* 13.0  HCT 44.9 40.6  MCV 97.6 99.3  PLT 277 252   Cardiac Enzymes:  Recent Labs Lab 02/06/15 0655 02/07/15 0610 02/09/15 0552  CKTOTAL 457* 694* 274*   BNP: BNP (last 3 results) No results for input(s): BNP in the last 8760 hours.  ProBNP (last 3 results) No results for input(s): PROBNP in the last 8760 hours.  CBG: No results for input(s): GLUCAP in the last 168 hours.     SignedKathlen Mody  [facility] 02/09/2015, 11:20 AM

## 2015-02-09 NOTE — Progress Notes (Signed)
CSW spoke with facility to inform that patient is medically stable and ready for discharge. Per Burna MortimerWanda, send clinical information over for review. Representative will follow up with CSW after clinicals are reviewed.'  Fernande BoydenJoyce Eeva Schlosser, [facility] VictoriaCSWA Clinical Social Worker [facility] [phone]

## 2015-05-06 NOTE — Progress Notes (Signed)
Cardiology Office Note   Date:  05/08/2015   ID:  Frances Jackson, DOB 1921-05-28, MRN [id number]  PCP:  No primary care provider on file.  Cardiologist:   Madilyn Hook, MD   Chief Complaint  Patient presents with  . New Evaluation    no chest pain, some shortness of breath, swelling, no cramping, some dizziness      History of Present Illness: Frances Jackson is a 82 y.o. female with hypertension, dementia and anxiety who presents for an evaluation of lower extremity edema.  Frances Jackson is a resident at [facility].  She presents today with Frances daughter who is concerned that Frances Jackson may have heart failure.  Frances Jackson has dementia and significant hearing loss.  Therefore, most of the history was obtained from Frances daugher.  She notes that Frances Jackson's ankles are often swollen and that she is short of breath.  She often appears short of breath when walking or when sitting.  She is not short of breath when lying down.  Frances daughter has noted the edema for the last two years.  She has been on lasix chronically, and recently the dose was reduced in order to reduce Frances need to urinate, given that she is unsteady on Frances feet.  However, after making that change Frances edema increased significantly.  She started back taking lasix 20 mg daily, but continues to have lower extremity edema.  She denies chest pain or pressure.  Frances daughter wonders if she may qualify for hospice and palliative care.   Past Medical History  Diagnosis Date  . Anxiety   . Arthritis   . Dementia   . Hypertension   . Edema 05/08/2015  . Essential hypertension 05/08/2015    Past Surgical History  Procedure Laterality Date  . Arm surgery       Current Outpatient Prescriptions  Medication Sig Dispense Refill  . acetaminophen (TYLENOL) 325 MG tablet Take 650 mg by mouth 3 (three) times daily.    Marland Kitchen acetaminophen (TYLENOL) 500 MG tablet Take 500 mg by mouth every 12 (twelve)  hours as needed for mild pain.    Marland Kitchen amLODipine (NORVASC) 5 MG tablet Take 5 mg by mouth daily.    . calcium carbonate (ANTACID) 500 MG chewable tablet Chew 1 tablet by mouth 2 (two) times daily.    . clonazePAM (KLONOPIN) 0.5 MG tablet Take 0.5 mg by mouth 2 (two) times daily as needed for anxiety.    . Cranberry 425 MG CAPS Take 1 capsule by mouth daily.    . furosemide (LASIX) 20 MG tablet Take 20 mg by mouth daily.     Marland Kitchen HYDROcodone-acetaminophen (NORCO/VICODIN) 5-325 MG tablet Take 1 tablet by mouth every 6 (six) hours as needed for moderate pain.    Marland Kitchen levothyroxine (SYNTHROID, LEVOTHROID) 50 MCG tablet Take 50 mcg by mouth daily before breakfast.    . mineral oil external liquid Place 2 drops into the right ear once a week.    . NON FORMULARY Take 6 oz by mouth 2 (two) times daily. 6 oz of wine.    . nortriptyline (PAMELOR) 10 MG capsule Take 10 mg by mouth at bedtime.    . simvastatin (ZOCOR) 20 MG tablet Take 20 mg by mouth at bedtime.    Marland Kitchen losartan (COZAAR) 25 MG tablet Take 1 tablet (25 mg total) by mouth daily. 30 tablet 11   No current facility-administered medications for this visit.  Allergies:   Review of patient's allergies indicates no known allergies.    Social History:  The patient  reports that she has never smoked. She has never used smokeless tobacco. She reports that she drinks about 16.8 oz of alcohol per week. She reports that she does not use illicit drugs.   Family History:  The patient's family history is not on file.    ROS:  Please see the history of present illness.   Otherwise, review of systems are positive for none.   All other systems are reviewed and negative.    PHYSICAL EXAM: VS:  BP 120/70 mmHg  Pulse 86  Ht  (1.626 m)  Wt 65.772 kg (145 lb)  BMI 24.88 kg/m2 , BMI Body mass index is 24.88 kg/(m^2). GENERAL:  Well appearing HEENT:  Pupils equal round and reactive, fundi not visualized, oral mucosa unremarkable NECK:  No jugular venous  distention, waveform within normal limits, carotid upstroke brisk and symmetric, no bruits, no thyromegaly LYMPHATICS:  No cervical adenopathy LUNGS:  Clear to auscultation bilaterally HEART:  RRR.  PMI not displaced or sustained,S1 and S2 within normal limits, no S3, no S4, no clicks, no rubs, no murmurs ABD:  Flat, positive bowel sounds normal in frequency in pitch, no bruits, no rebound, no guarding, no midline pulsatile mass, no hepatomegaly, no splenomegaly EXT:  2 plus pulses throughout, 2+ edema to above the ankles bilaterally, both pitting an dn no cyanosis no clubbing SKIN:  No rashes no nodules NEURO:  Cranial nerves II through XII grossly intact, motor grossly intact throughout PSYCH:  Cognitively intact, oriented to person place and time   EKG:  EKG is ordered today. The ekg ordered today demonstrates sinus rhythm rate 86 bpm.  L axis deviation.     Recent Labs: 02/06/2015: ALT 21; BUN 9; Creatinine, Ser 0.65; Magnesium 2.0; Potassium 4.5; Sodium 140 02/07/2015: Hemoglobin 13.0; Platelets 252; TSH 2.806    Lipid Panel No results found for: CHOL, TRIG, HDL, CHOLHDL, VLDL, LDLCALC, LDLDIRECT    Wt Readings from Last 3 Encounters:  05/07/15 65.772 kg (145 lb)  02/06/15 71.2 kg (156 lb 15.5 oz)  05/04/13 60.328 kg (133 lb)      ASSESSMENT AND PLAN:  # Hypertension:  Blood pressure well-controlled.  However, she has lower extremity edema which may be attributable to Frances amlodipine.  We will stop amlodipine and start losartan 25 mg daily.  Check BMP today and in 1 week.  # Edema, shortness of breath:  Ms. Jackson has both pitting and non-pitting edema.  She certainly may have some heart failure.  However, Frances neck veins are not distended and she is able to lay flat.  I do not get the sense that she has severe heart failure.  We will obtain an echo to evaluate for heart failure.  We will also check a BMP and likely increase Frances lasix based on the results of the BMP.  #  Palliative care: Frances Jackson and Frances daughter are most concerned with palliation.  She has no known atherosclerotic disease.  Stop simvastatin.  She does not have chronic pain but receives scheduled hydrocodone/acetaminophen.  This is on Frances medication list from when she had a fall some time ago.  We will discontinue vicodin as well.  Current medicines are reviewed at length with the patient today.  The patient does not have concerns regarding medicines.  The following changes have been made:  Switch amlodipine to losartan.  Stop simvastatin and vicodin.  Labs/ tests ordered today include:   Orders Placed This Encounter  Procedures  . Basic metabolic panel  . Brain natriuretic peptide  . Basic metabolic panel  . EKG 12-Lead  . ECHOCARDIOGRAM COMPLETE     Disposition:   FU with Hanin Decook C. Duke Salvia, MD, [facility] in 6 months.    This note was written with the assistance of speech recognition software.  Please excuse any transcriptional errors.  Signed, Shaguana Love C. Duke Salvia, MD, [facility]  05/08/2015 12:05 PM    [facility]

## 2015-05-07 ENCOUNTER — Encounter: Payer: Self-pay | Admitting: Cardiovascular Disease

## 2015-05-07 ENCOUNTER — Ambulatory Visit (INDEPENDENT_AMBULATORY_CARE_PROVIDER_SITE_OTHER): Payer: Medicare Other | Admitting: Cardiovascular Disease

## 2015-05-07 VITALS — BP 120/70 | HR 86 | Ht 64.0 in | Wt 145.0 lb

## 2015-05-07 DIAGNOSIS — R0609 Other forms of dyspnea: Secondary | ICD-10-CM | POA: Diagnosis not present

## 2015-05-07 DIAGNOSIS — R0602 Shortness of breath: Secondary | ICD-10-CM | POA: Diagnosis not present

## 2015-05-07 DIAGNOSIS — R600 Localized edema: Secondary | ICD-10-CM | POA: Diagnosis not present

## 2015-05-07 DIAGNOSIS — I10 Essential (primary) hypertension: Secondary | ICD-10-CM

## 2015-05-07 DIAGNOSIS — R609 Edema, unspecified: Secondary | ICD-10-CM

## 2015-05-07 LAB — BASIC METABOLIC PANEL
BUN: 13 mg/dL (ref 7–25)
CALCIUM: 9.5 mg/dL (ref 8.6–10.4)
CO2: 25 mmol/L (ref 20–31)
Chloride: 100 mmol/L (ref 98–110)
Creat: 0.84 mg/dL (ref 0.60–0.88)
Glucose, Bld: 103 mg/dL — ABNORMAL HIGH (ref 65–99)
POTASSIUM: 4.1 mmol/L (ref 3.5–5.3)
SODIUM: 137 mmol/L (ref 135–146)

## 2015-05-07 MED ORDER — LOSARTAN POTASSIUM 25 MG PO TABS: 25.0000 mg | ORAL_TABLET | Freq: Every day | ORAL | Status: DC

## 2015-05-07 NOTE — Patient Instructions (Signed)
STOP AMLODIPINE   STOP SIMVASTATIN   STOP HYDROCODONE   START LOSARTAN 25 MG ONE TABLET BY MOUTH IN THE MORNING DAILY.   LABS TODAY BNP ,BMP  IN 7 DAYS AFTER STARTING LOSARTAN , -LABS BMP  Your physician has requested that you have an echocardiogram [address]. Echocardiography is a painless test that uses sound waves to create images of your heart. It provides your doctor with information about the size and shape of your heart and how well your heart's chambers and valves are working. This procedure takes approximately one hour. There are no restrictions for this procedure.  Your physician wants you to follow-up in 6 MONTHS WITH DR [facility].  You will receive a reminder letter in the mail two months in advance. If you don't receive a letter, please call our office to schedule the follow-up appointment.  If you need a refill on your cardiac medications before your next appointment, please call your pharmacy.

## 2015-05-08 ENCOUNTER — Encounter: Payer: Self-pay | Admitting: Cardiovascular Disease

## 2015-05-08 DIAGNOSIS — I10 Essential (primary) hypertension: Secondary | ICD-10-CM

## 2015-05-08 DIAGNOSIS — R609 Edema, unspecified: Secondary | ICD-10-CM

## 2015-05-08 HISTORY — DX: Edema, unspecified: R60.9

## 2015-05-08 HISTORY — DX: Essential (primary) hypertension: I10

## 2015-05-08 LAB — BRAIN NATRIURETIC PEPTIDE: BRAIN NATRIURETIC PEPTIDE: 31.2 pg/mL (ref <100)

## 2015-05-13 ENCOUNTER — Other Ambulatory Visit: Payer: Self-pay | Admitting: Cardiovascular Disease

## 2015-05-13 ENCOUNTER — Ambulatory Visit (HOSPITAL_COMMUNITY)
Admission: RE | Admit: 2015-05-13 | Discharge: 2015-05-13 | Disposition: A | Discharge status: Home / Self Care | Payer: Medicare Other | Source: Ambulatory Visit | Attending: Cardiology | Admitting: Cardiology

## 2015-05-13 DIAGNOSIS — I358 Other nonrheumatic aortic valve disorders: Secondary | ICD-10-CM | POA: Diagnosis not present

## 2015-05-13 DIAGNOSIS — I119 Hypertensive heart disease without heart failure: Secondary | ICD-10-CM | POA: Insufficient documentation

## 2015-05-13 DIAGNOSIS — I5189 Other ill-defined heart diseases: Secondary | ICD-10-CM | POA: Diagnosis not present

## 2015-05-13 DIAGNOSIS — I071 Rheumatic tricuspid insufficiency: Secondary | ICD-10-CM | POA: Diagnosis not present

## 2015-05-13 DIAGNOSIS — I340 Nonrheumatic mitral (valve) insufficiency: Secondary | ICD-10-CM | POA: Insufficient documentation

## 2015-05-13 DIAGNOSIS — R0600 Dyspnea, unspecified: Secondary | ICD-10-CM | POA: Diagnosis not present

## 2015-05-14 ENCOUNTER — Telehealth: Payer: Self-pay | Admitting: *Deleted

## 2015-05-14 NOTE — Telephone Encounter (Signed)
SPOKE TO [facility] FAX NUMBER OBTAINED TO SEND ORDERS  [phone]  ROUTED THIS TELEPHONE MESSAGE- INSTRUCTION SIGNED BY DR RANDOPLH   ORDER INCREASE LASIX TO 40 MG  FOR 3 DAYS THEN RETURN TO 20 MG DAILY ( TO SEE IF HELPS SWELLING) TRY TO ELEVATE LEGS WHILE SITTING AVOID SALT INTAKE WEAR COMPRESSION STOCKING

## 2015-05-14 NOTE — Telephone Encounter (Signed)
-----   Message from Tiffany [place], MD sent at 05/10/2015  5:37 PM EST ----- Normal BNP so she does not have any significant heart failure.  Increase lasix to 40 mg daily for 3 days to see if that helps with the swelling.  Otherwise, try to elevate her legs as much as possible, avoid salt and wear compression stockings if she is able. 

## 2015-05-14 NOTE — Telephone Encounter (Signed)
OPEN ERROR

## 2015-05-14 NOTE — Telephone Encounter (Signed)
Spoke to daughter  results  given , instruction  request to send orders to [facility]- PATIENT IS A RESIDENT   DAUGHTER STATES PATIENT WILL PROBABLY NOT USE COMPRESSION STOCKING BUT SHE WILL TRY TO PLACE THE STOCKING ON PATIENT.

## 2015-05-14 NOTE — Telephone Encounter (Signed)
-----   Message from Chilton Si, MD sent at 05/10/2015  5:37 PM EST ----- Normal BNP so she does not have any significant heart failure.  Increase lasix to 40 mg daily for 3 days to see if that helps with the swelling.  Otherwise, try to elevate her legs as much as possible, avoid salt and wear compression stockings if she is able.

## 2015-05-14 NOTE — Telephone Encounter (Signed)
-----   Message from Chilton Si, MD sent at 05/14/2015  4:19 PM EST ----- Echo shows that her heart squeezes normally.  It also shows that her heart does not relax completely.  It will be important keep her blood pressure well-controlled. There was some mild leaking of the mitral and tricuspid valves.  There are no changes to her current plan based on this echo.  It does not appear that she has any significant heart failure.

## 2015-05-14 NOTE — Telephone Encounter (Signed)
LEFT MESSAGE TO CALL BACK

## 2015-05-14 NOTE — Telephone Encounter (Signed)
Left message to call back Regarding lab work

## 2015-05-17 ENCOUNTER — Telehealth: Payer: Self-pay | Admitting: Cardiovascular Disease

## 2015-05-17 NOTE — Telephone Encounter (Signed)
Follow Up   Daughter returned call for ECHO results

## 2015-05-17 NOTE — Telephone Encounter (Signed)
Returning your call. °

## 2015-05-17 NOTE — Telephone Encounter (Signed)
Left message to call back  

## 2015-05-17 NOTE — Telephone Encounter (Signed)
Results given to daughter by Cala Bradford RN

## 2015-05-17 NOTE — Telephone Encounter (Signed)
Pt results given to daughter.

## 2015-05-28 LAB — BASIC METABOLIC PANEL
BUN: 11 mg/dL (ref 7–25)
CHLORIDE: 102 mmol/L (ref 98–110)
CO2: 25 mmol/L (ref 20–31)
Calcium: 8.9 mg/dL (ref 8.6–10.4)
Creat: 0.86 mg/dL (ref 0.60–0.88)
GLUCOSE: 98 mg/dL (ref 65–99)
POTASSIUM: 4.4 mmol/L (ref 3.5–5.3)
Sodium: 138 mmol/L (ref 135–146)

## 2015-06-10 ENCOUNTER — Other Ambulatory Visit (HOSPITAL_COMMUNITY): Payer: Medicare Other

## 2015-12-23 ENCOUNTER — Encounter: Payer: Self-pay | Admitting: Podiatry

## 2015-12-23 ENCOUNTER — Ambulatory Visit (INDEPENDENT_AMBULATORY_CARE_PROVIDER_SITE_OTHER): Payer: Medicare Other | Admitting: Podiatry

## 2015-12-23 VITALS — BP 151/82 | HR 84 | Resp 14

## 2015-12-23 DIAGNOSIS — M79676 Pain in unspecified toe(s): Secondary | ICD-10-CM

## 2015-12-23 DIAGNOSIS — B351 Tinea unguium: Secondary | ICD-10-CM

## 2015-12-23 NOTE — Progress Notes (Addendum)
   Subjective:    Patient ID: Frances PiggMary Ann Jackson, female    DOB: 02/18/1922, 82 y.o.   MRN: [id number]  HPI this patient presents the office with chief complaint of long, thick, ingrowing toenails, especially on the big toes of both feet. She states that is painful wearing her shoes. She denies any drainage from this area. She presents the office for preventative foot care services    Review of Systems  All other systems reviewed and are negative.      Objective:   Physical Exam GENERAL APPEARANCE: Alert, conversant. Appropriately groomed. No acute distress.  VASCULAR: Pedal pulses are not   palpable at  [facility] and [facility] bilateral.  Capillary refill time is immediate to all digits,  Normal temperature gradient.   NEUROLOGIC: sensation is normal to 5.07 monofilament at 5/5 sites bilateral.  Light touch is intact bilateral, Muscle strength normal.  MUSCULOSKELETAL: acceptable muscle strength, tone and stability bilateral.  Intrinsic muscluature intact bilateral.  Rectus appearance of foot and digits noted bilateral.   DERMATOLOGIC: skin color, texture, and turgor are within normal limits.  No preulcerative lesions or ulcers  are seen, no interdigital maceration noted.  No open lesions present.  . No drainage noted.  NAILS  Long thick ingrown toenails both great toes.         Assessment & Plan:  Onychomycosis  B/L   Debridement and grinding long incurvated nails hallux  B/L RTC 3 months   Helane GuntherGregory Mayer DPM

## 2016-03-07 ENCOUNTER — Emergency Department (HOSPITAL_COMMUNITY)
Admission: EM | Admit: 2016-03-07 | Discharge: 2016-03-07 | Disposition: A | Discharge status: Home / Self Care | Payer: Medicare Other | Attending: Emergency Medicine | Admitting: Emergency Medicine

## 2016-03-07 ENCOUNTER — Emergency Department (HOSPITAL_COMMUNITY): Payer: Medicare Other

## 2016-03-07 ENCOUNTER — Encounter (HOSPITAL_COMMUNITY): Payer: Self-pay | Admitting: Emergency Medicine

## 2016-03-07 DIAGNOSIS — S0990XA Unspecified injury of head, initial encounter: Secondary | ICD-10-CM | POA: Diagnosis present

## 2016-03-07 DIAGNOSIS — S0083XA Contusion of other part of head, initial encounter: Secondary | ICD-10-CM | POA: Insufficient documentation

## 2016-03-07 DIAGNOSIS — Y939 Activity, unspecified: Secondary | ICD-10-CM | POA: Insufficient documentation

## 2016-03-07 DIAGNOSIS — Y929 Unspecified place or not applicable: Secondary | ICD-10-CM | POA: Diagnosis not present

## 2016-03-07 DIAGNOSIS — Y999 Unspecified external cause status: Secondary | ICD-10-CM | POA: Diagnosis not present

## 2016-03-07 DIAGNOSIS — I10 Essential (primary) hypertension: Secondary | ICD-10-CM | POA: Diagnosis not present

## 2016-03-07 DIAGNOSIS — S40012A Contusion of left shoulder, initial encounter: Secondary | ICD-10-CM | POA: Diagnosis not present

## 2016-03-07 DIAGNOSIS — W19XXXA Unspecified fall, initial encounter: Secondary | ICD-10-CM | POA: Diagnosis not present

## 2016-03-07 LAB — URINALYSIS, ROUTINE W REFLEX MICROSCOPIC
Bilirubin Urine: NEGATIVE
GLUCOSE, UA: NEGATIVE mg/dL
Ketones, ur: NEGATIVE mg/dL
Nitrite: NEGATIVE
PH: 7 (ref 5.0–8.0)
PROTEIN: NEGATIVE mg/dL
Specific Gravity, Urine: 1.005 (ref 1.005–1.030)

## 2016-03-07 NOTE — ED Provider Notes (Signed)
[facility]-EMERGENCY DEPT Provider Note   CSN: [id number] Arrival date & time: 03/07/16  0406     History   Chief Complaint Chief Complaint  Patient presents with  . Fall    HPI Frances Jackson is a 82 y.o. female.  HPI  This is a 82 year old female with history of dementia who presents from her living facility following a fall. Per report, she was found on the ground. Patient states that she remembers trying to get up from her bed. She is unable to describe mechanical fall. She does deny syncope. Reported prior fall yesterday. Patient is oriented to self and place but not time. She denies any pain but reported neck pain to EMS. She denies any chest pain or shortness of breath. No recent illnesses.  Level V caveat for dementia  Past Medical History:  Diagnosis Date  . Anxiety   . Arthritis   . Dementia   . Edema 05/08/2015  . Essential hypertension 05/08/2015  . Hypertension     Patient Active Problem List   Diagnosis Date Noted  . Edema 05/08/2015  . Essential hypertension 05/08/2015  . Lactic acidosis 02/06/2015  . UTI (lower urinary tract infection) 02/06/2015    Past Surgical History:  Procedure Laterality Date  . arm surgery      OB History    No data available       Home Medications    Prior to Admission medications   Medication Sig Start Date End Date Taking? Authorizing Provider  acetaminophen (TYLENOL) 325 MG tablet Take 650 mg by mouth 3 (three) times daily.    Historical Provider, MD  acetaminophen (TYLENOL) 500 MG tablet Take 500 mg by mouth every 12 (twelve) hours as needed for mild pain.    Historical Provider, MD  amLODipine (NORVASC) 5 MG tablet Take 5 mg by mouth daily.    Historical Provider, MD  calcium carbonate (ANTACID) 500 MG chewable tablet Chew 1 tablet by mouth 2 (two) times daily.    Historical Provider, MD  clonazePAM (KLONOPIN) 0.5 MG tablet Take 0.5 mg by mouth 2 (two) times daily as needed for anxiety.    Historical Provider, MD    Cranberry 425 MG CAPS Take 1 capsule by mouth daily.    Historical Provider, MD  furosemide (LASIX) 20 MG tablet Take 20 mg by mouth daily.     Historical Provider, MD  HYDROcodone-acetaminophen (NORCO/VICODIN) 5-325 MG tablet Take 1 tablet by mouth every 6 (six) hours as needed for moderate pain.    Historical Provider, MD  levothyroxine (SYNTHROID, LEVOTHROID) 50 MCG tablet Take 50 mcg by mouth daily before breakfast.    Historical Provider, MD  losartan (COZAAR) 25 MG tablet Take 1 tablet (25 mg total) by mouth daily. 05/07/15   Chilton Si, MD  mineral oil external liquid Place 2 drops into the right ear once a week.    Historical Provider, MD  NON FORMULARY Take 6 oz by mouth 2 (two) times daily. 6 oz of wine.    Historical Provider, MD  nortriptyline (PAMELOR) 10 MG capsule Take 10 mg by mouth at bedtime.    Historical Provider, MD  simvastatin (ZOCOR) 20 MG tablet Take 20 mg by mouth at bedtime.    Historical Provider, MD    Family History No family history on file.  Social History Social History  Substance Use Topics  . Smoking status: Never Smoker  . Smokeless tobacco: Never Used  . Alcohol use 16.8 oz/week    28 Glasses  of wine per week     Allergies   Patient has no known allergies.   Review of Systems Review of Systems  Unable to perform ROS: Dementia  Respiratory: Negative for shortness of breath.   Cardiovascular: Negative for chest pain.  Neurological: Negative for syncope.     Physical Exam Updated Vital Signs BP 178/65 (BP Location: Left Arm)   Pulse 92   Temp 97.7 F (36.5 C) (Oral)   Resp 23   SpO2 94%   Physical Exam  Constitutional: No distress.  Elderly, no acute distress  HENT:  Head: Normocephalic.  Mouth/Throat: Oropharynx is clear and moist.  Abrasion with mild swelling noted to the lateral aspect of the left cheek  Eyes: Pupils are equal, round, and reactive to light.  Neck: Neck supple.  Immobilized  Cardiovascular: Normal  rate, regular rhythm and normal heart sounds.   No murmur heard. Pulmonary/Chest: Effort normal and breath sounds normal. No respiratory distress. She has no wheezes.  Abdominal: Soft. Bowel sounds are normal. There is no tenderness. There is no guarding.  Musculoskeletal:  Bruising noted to the left shoulder, normal range of motion, no obvious deformities, 2+ radial pulse, normal range of motion of bilateral hips and knees, no obvious deformities or pain with range of motion  Neurological: She is alert.  Oriented 2  Skin: Skin is warm and dry.  Psychiatric: She has a normal mood and affect.  Nursing note and vitals reviewed.    ED Treatments / Results  Labs (all labs ordered are listed, but only abnormal results are displayed) Labs Reviewed  URINALYSIS, ROUTINE W REFLEX MICROSCOPIC - Abnormal; Notable for the following:       Result Value   APPearance HAZY (*)    Hgb urine dipstick SMALL (*)    Leukocytes, UA TRACE (*)    Bacteria, UA FEW (*)    Squamous Epithelial / LPF 0-5 (*)    All other components within normal limits    EKG  EKG Interpretation None       Radiology Ct Head Wo Contrast  Result Date: 03/07/2016 CLINICAL DATA:  82 year old female with fall. EXAM: CT HEAD WITHOUT CONTRAST CT CERVICAL SPINE WITHOUT CONTRAST TECHNIQUE: Multidetector CT imaging of the head and cervical spine was performed following the standard protocol without intravenous contrast. Multiplanar CT image reconstructions of the cervical spine were also generated. COMPARISON:  Head CT dated 02/06/2015 FINDINGS: CT HEAD FINDINGS Brain: There is moderate age-related atrophy and chronic microvascular ischemic changes. There is no acute intracranial hemorrhage. No mass effect or midline shift noted. No extra-axial fluid collection. Vascular: No hyperdense vessel or unexpected calcification. Skull: Normal. Negative for fracture or focal lesion. Sinuses/Orbits: No acute finding. Other: Cerumen noted in  the right external auditory canal. CT CERVICAL SPINE FINDINGS Alignment: No acute subluxation.  Grade 1 C7-T1 anterolisthesis. Skull base and vertebrae: No acute fracture. No primary bone lesion or focal pathologic process. Soft tissues and spinal canal: No prevertebral fluid or swelling. No visible canal hematoma. Disc levels: There are multilevel disc disease with disc space narrowing and endplate irregularity. Disc desiccation with vacuum phenomena noted at C3-C4. Upper chest: Biapical densities are not well evaluated likely scarring. Other: Bilateral carotid bulb calcified plaques. IMPRESSION: No acute intracranial hemorrhage. Age-related atrophy and chronic microvascular ischemic changes. No acute/ traumatic cervical spine pathology. Multilevel degenerative changes. Electronically Signed   By: Elgie CollardArash  Radparvar M.D.   On: 03/07/2016 05:24   Ct Cervical Spine Wo Contrast  Result Date:  03/07/2016 CLINICAL DATA:  82 year old female with fall. EXAM: CT HEAD WITHOUT CONTRAST CT CERVICAL SPINE WITHOUT CONTRAST TECHNIQUE: Multidetector CT imaging of the head and cervical spine was performed following the standard protocol without intravenous contrast. Multiplanar CT image reconstructions of the cervical spine were also generated. COMPARISON:  Head CT dated 02/06/2015 FINDINGS: CT HEAD FINDINGS Brain: There is moderate age-related atrophy and chronic microvascular ischemic changes. There is no acute intracranial hemorrhage. No mass effect or midline shift noted. No extra-axial fluid collection. Vascular: No hyperdense vessel or unexpected calcification. Skull: Normal. Negative for fracture or focal lesion. Sinuses/Orbits: No acute finding. Other: Cerumen noted in the right external auditory canal. CT CERVICAL SPINE FINDINGS Alignment: No acute subluxation.  Grade 1 C7-T1 anterolisthesis. Skull base and vertebrae: No acute fracture. No primary bone lesion or focal pathologic process. Soft tissues and spinal canal:  No prevertebral fluid or swelling. No visible canal hematoma. Disc levels: There are multilevel disc disease with disc space narrowing and endplate irregularity. Disc desiccation with vacuum phenomena noted at C3-C4. Upper chest: Biapical densities are not well evaluated likely scarring. Other: Bilateral carotid bulb calcified plaques. IMPRESSION: No acute intracranial hemorrhage. Age-related atrophy and chronic microvascular ischemic changes. No acute/ traumatic cervical spine pathology. Multilevel degenerative changes. Electronically Signed   By: Elgie CollardArash  Radparvar M.D.   On: 03/07/2016 05:24    Procedures Procedures (including critical care time)  Medications Ordered in ED Medications - No data to display   Initial Impression / Assessment and Plan / ED Course  I have reviewed the triage vital signs and the nursing notes.  Pertinent labs & imaging results that were available during my care of the patient were reviewed by me and considered in my medical decision making (see chart for details).  Clinical Course     Patient presents following a fall. Circumstances surrounding fall unclear. History of dementia. Oriented 2. Contusion to the face and left shoulder. No deformities. Denies pain anywhere. She does have a history of dementia. EKG shows no evidence of arrhythmia. Urinalysis without evidence of infection. CT head and neck negative. No imaging obtained of the shoulder as no deformities with full range of motion. Will discharge back to living facility.  After history, exam, and medical workup I feel the patient has been appropriately medically screened and is safe for discharge home. Pertinent diagnoses were discussed with the patient. Patient was given return precautions.   Final Clinical Impressions(s) / ED Diagnoses   Final diagnoses:  Fall, initial encounter  Contusion of face, initial encounter    New Prescriptions New Prescriptions   No medications on file     Shon Batonourtney F  Teale Goodgame, MD 03/07/16 [phone]

## 2016-03-07 NOTE — ED Notes (Signed)
Attempted in and out cath x1, p did not tolerate very well. Attempted bedpan and pt not successful. Will try again in 15 minutes

## 2016-03-07 NOTE — ED Triage Notes (Signed)
Pt arrives via EMS from [facility] for fall x2 today, once this afternoon and once at 0230 this AM. Found down between dresser drawer and couch. Hx of dementia. States pain in R mid neck. Bruising to L shoulder/arm, L leg, abrasions to L face and R knee. IV placed in field. Pt in 1st degree AV block on EMS EKG.

## 2016-03-30 ENCOUNTER — Ambulatory Visit: Payer: Medicare Other | Admitting: Podiatry

## 2016-06-13 IMAGING — CT CT MAXILLOFACIAL W/O CM
4 of 10 series · 16 of 47 positions shown, 18 images · non-contrast
Comparison: 05/20/2012

CLINICAL DATA: Nurse note: found on floor @ 7387 by staff, pt last
seen 2822. Pt is at baseline mental status, hx of dementia. Bruising
noted around L eye and swelling to L cheek. Dried blood noted to L
nare

EXAM:
CT HEAD WITHOUT CONTRAST
CT MAXILLOFACIAL WITHOUT CONTRAST
CT CERVICAL SPINE WITHOUT CONTRAST
TECHNIQUE: Multidetector CT imaging of the head, cervical spine, and
maxillofacial structures were performed using the standard protocol
without intravenous contrast. Multiplanar CT image reconstructions
of the cervical spine and maxillofacial structures were also
generated.

[Series 7: c-spine st · axial · 0.23mm/px · z∈[-272,-154]mm · 6 of 83 slices shown, 8 images]
[im 12/83  brain]
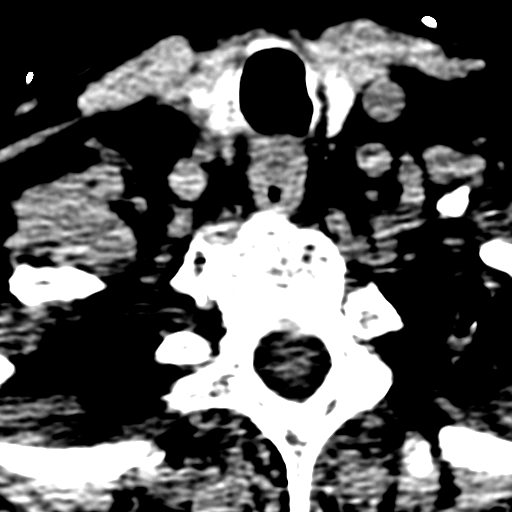
[im 12/83  bone]
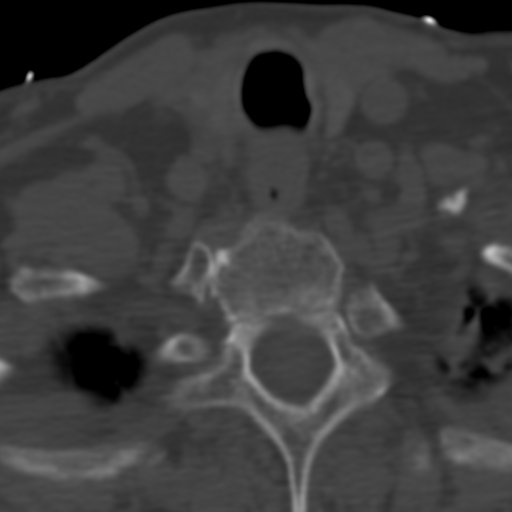
[im 24/83  bone]
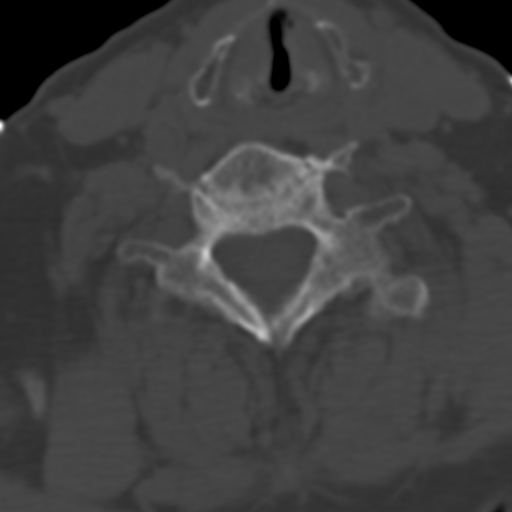
[im 36/83  bone]
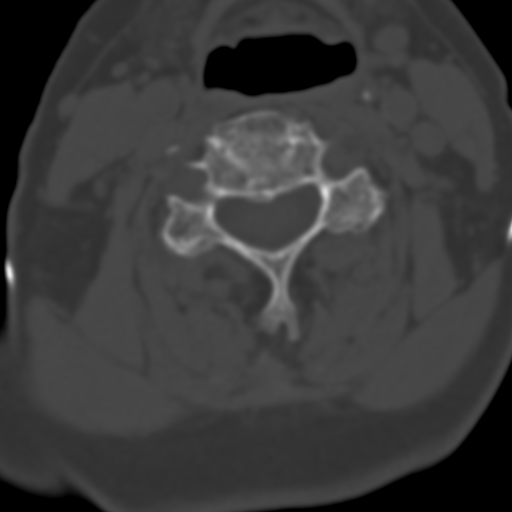
[im 47/83  bone]
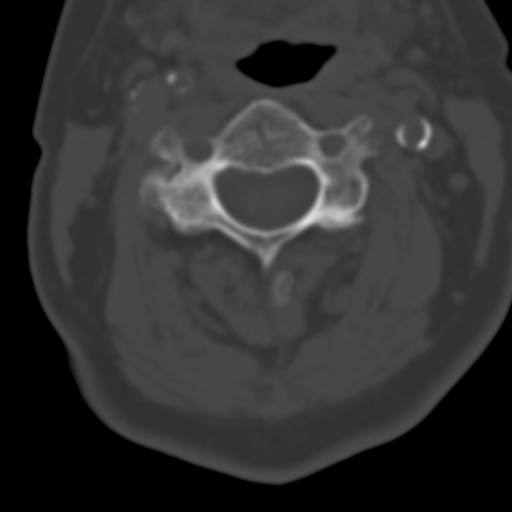
[im 59/83  brain]
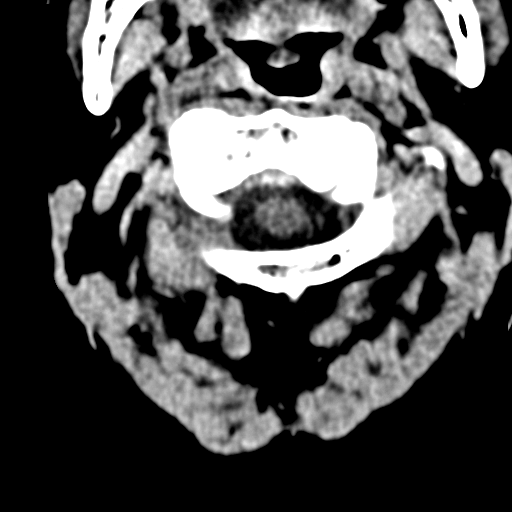
[im 59/83  bone]
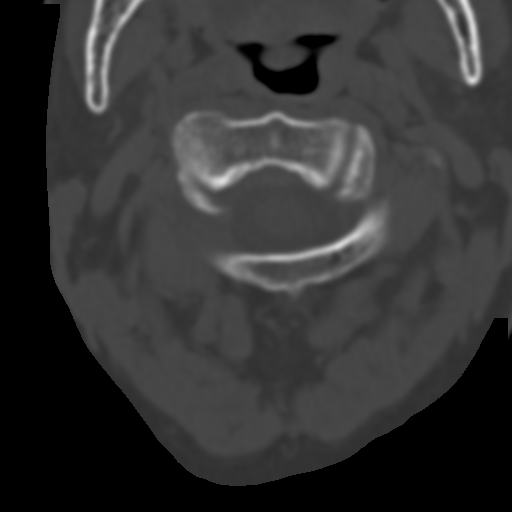
[im 71/83  bone]
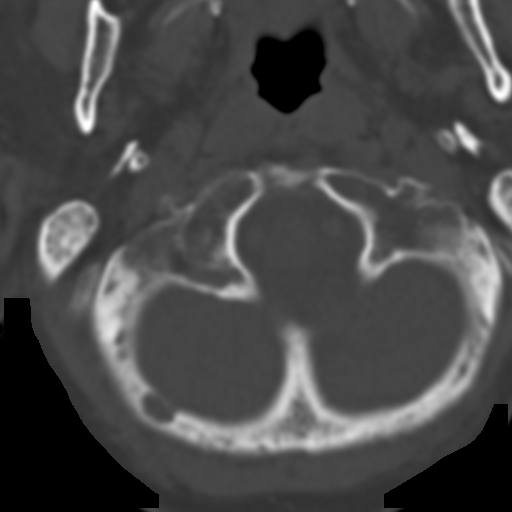

[Series 12: coronal st · coronal · 0.34mm/px · 3 of 76 slices shown]
[im 22/76  bone]
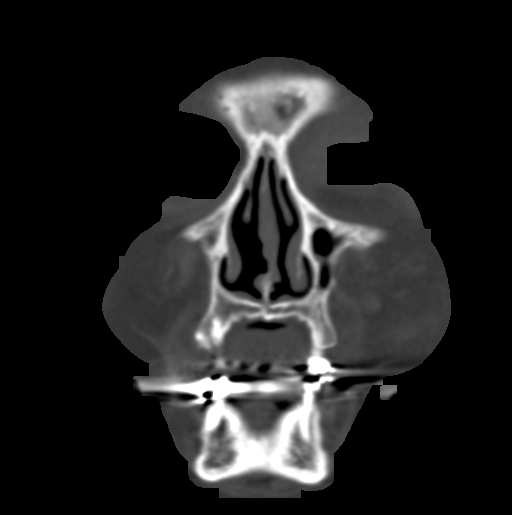
[im 33/76  bone]
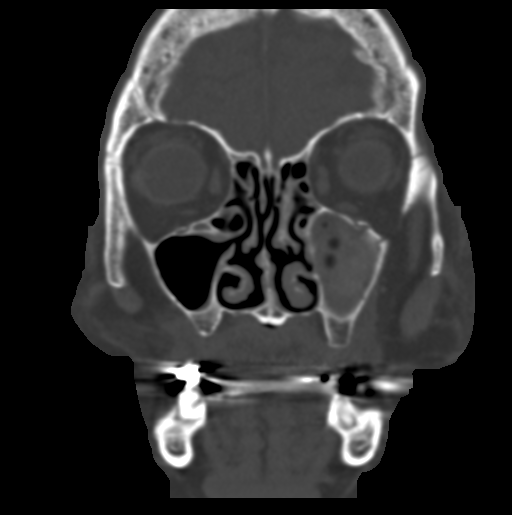
[im 43/76  bone]
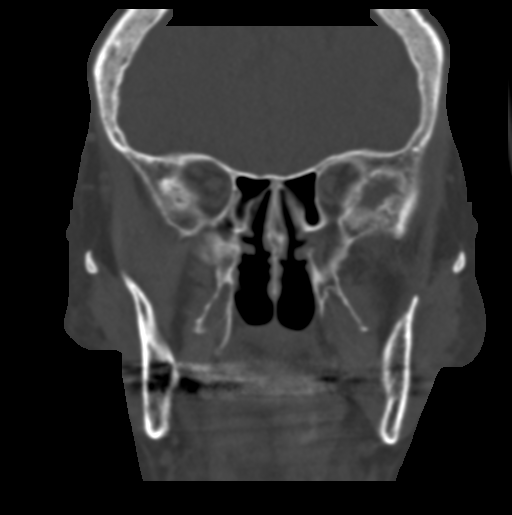

[Series 13: sagittal st · sagittal · 0.34mm/px · 1 of 80 slices shown]
[im 40/80  bone]
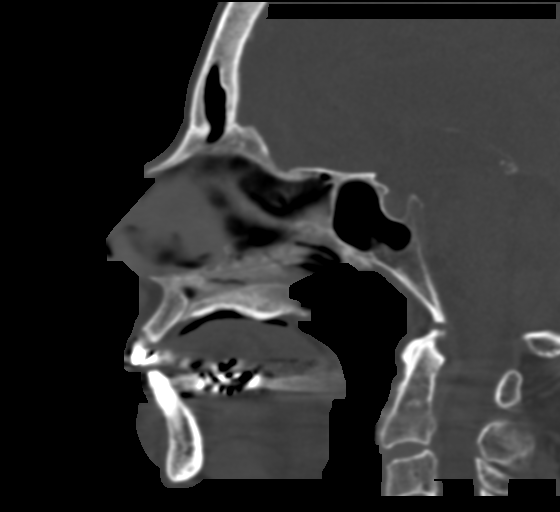

[Series 18: axial · axial · 0.23mm/px · z∈[-297,-196]mm · 6 of 82 slices shown]
[im 12/82  bone]
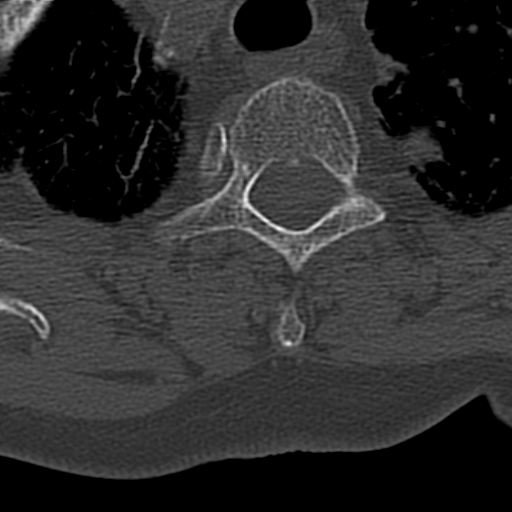
[im 24/82  bone]
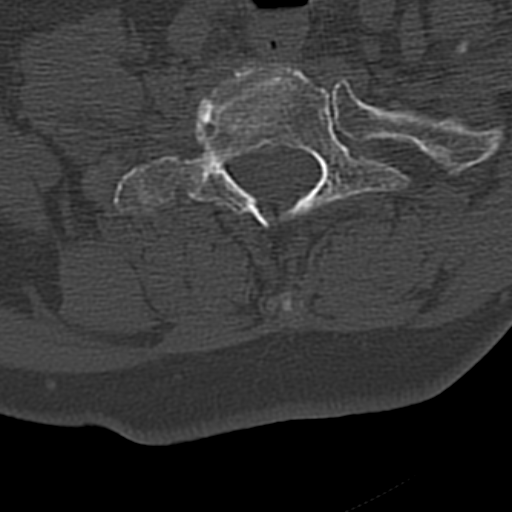
[im 35/82  bone]
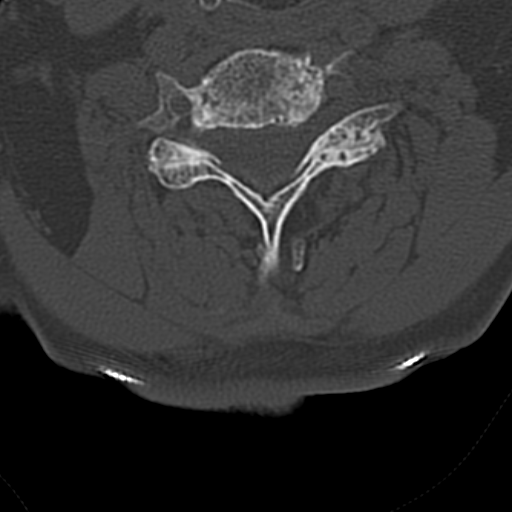
[im 47/82  bone]
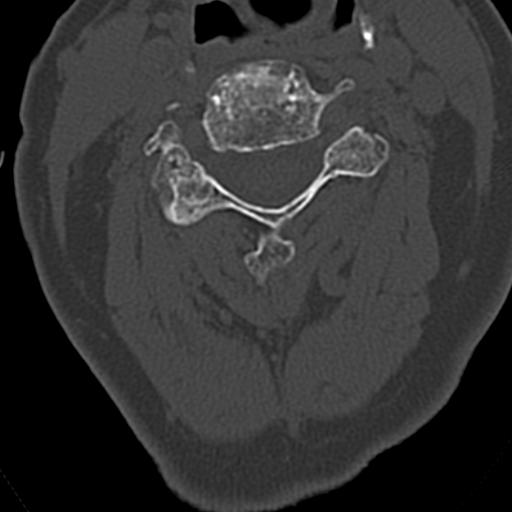
[im 58/82  bone]
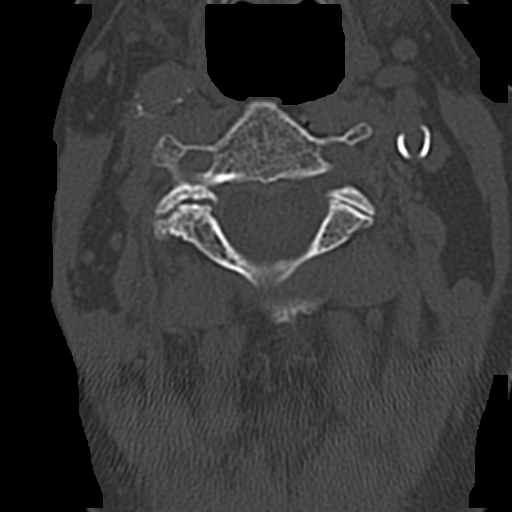
[im 70/82  bone]
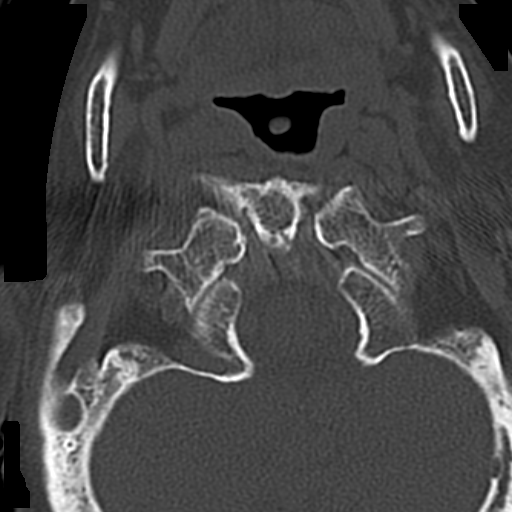

[16 of 47 positions shown; findings below may reference images not displayed]

FINDINGS: CT HEAD FINDINGS

Ventricles are normal in configuration. There is ventricular sulcal
enlargement reflecting moderate generalized atrophy.

No parenchymal masses or mass effect. No evidence of a cortical
infarct. Patchy white matter hypoattenuation is noted, stable,
consistent with moderate chronic microvascular ischemic change.

There are no extra-axial masses or abnormal fluid collections.

There is no intracranial hemorrhage.

No skull fracture.

CT MAXILLOFACIAL FINDINGS

Mouth fractures of the left maxillary sinus, along the lateral wall
cul mild comminution but no significant displacement. The fracture
appears to involve the posterior aspect of the orbital floor without
significant depression. Fracture also involves the anterior inferior
maxillary sinus wall without significant displacement or depression.
No other fractures. There is dependent hemorrhage in the left
maxillary sinus.

There is left cheek and left preseptal periorbital soft tissue
edema/ contusion. Small within cysts are noted consistent with
previous bilateral cataract surgery. Globes and orbits are otherwise
unremarkable.

Remaining sinuses are essentially clear. Clear mastoid air cells and
middle ear cavities. No soft tissue masses or adenopathy.

CT CERVICAL SPINE FINDINGS

No fracture. No spondylolisthesis. There are disc degenerative
changes from C3-C4 through C7-T1 with loss of disc height, marked
from C4-C5 through C6-C7, and endplate osteophytes. There are facet
degenerative changes bilaterally most evident on the right at C2-C3
and C3-C4.

Bones are diffusely demineralized. Soft tissues are unremarkable
other than carotid vascular calcifications. Scarring is noted in the
lung apices.
IMPRESSION: HEAD CT:  No acute intracranial abnormalities.  No skull fracture.

MAXILLOFACIAL CT: Fractures of the anterior and lateral left
maxillary sinus wall without significant displacement. There may be
additional nondisplaced fracture of the posterior left orbital
floor. Dependent hemorrhage is noted in the left maxillary sinus. No
other fractures.

CERVICAL CT:  No fracture or acute finding.

## 2017-01-14 ENCOUNTER — Encounter (HOSPITAL_COMMUNITY): Payer: Self-pay

## 2017-01-14 ENCOUNTER — Emergency Department (HOSPITAL_COMMUNITY)
Admission: EM | Admit: 2017-01-14 | Discharge: 2017-01-14 | Disposition: A | Discharge status: Home / Self Care | Payer: Medicare Other | Attending: Emergency Medicine | Admitting: Emergency Medicine

## 2017-01-14 DIAGNOSIS — Z5321 Procedure and treatment not carried out due to patient leaving prior to being seen by health care provider: Secondary | ICD-10-CM | POA: Diagnosis not present

## 2017-01-14 DIAGNOSIS — M79671 Pain in right foot: Secondary | ICD-10-CM | POA: Diagnosis present

## 2017-01-14 NOTE — ED Notes (Signed)
No answer

## 2017-01-14 NOTE — ED Notes (Signed)
Pt left unable to find in lobby.

## 2017-01-14 NOTE — ED Triage Notes (Signed)
States fell at assisted living on Friday got xray with avulsion fractures in right foot here today unable to bear weight due to pain and swelling in foot. Good circulation and sensation noted.

## 2017-01-14 NOTE — ED Notes (Signed)
Pt had a fall at her facility, [facility], where they had a portable x-ray done. The impression on the documentation states, "acute multiple avulsion injuries as discussed involving the talus, tarsal navicular, calcaneus and base of the fifth metatarsal." Non-ambulatory.

## 2017-08-17 ENCOUNTER — Non-Acute Institutional Stay: Payer: Medicare Other | Admitting: Hospice and Palliative Medicine

## 2017-08-17 DIAGNOSIS — Z515 Encounter for palliative care: Secondary | ICD-10-CM | POA: Insufficient documentation

## 2017-08-17 NOTE — Progress Notes (Signed)
PALLIATIVE CARE CONSULT VISIT   PATIENT NAME: Frances Jackson DOB: Aug 06, 1921 MRN: [id number]  PRIMARY CARE PROVIDER: Rockne Menghini NP  REFERRING PROVIDER: Daughter Murrell Redden, NP  RESPONSIBLE PARTY:      RECOMMENDATIONS and PLAN:  1.Weakness: secondary to  Advanced age of 32, generalized arthritis, several falls and CHF. She has exhausted PT. She should use the walker but wants to use the cane instead. She does not even put the cane on the ground half the time when she attempts to walk. She has had several falls mostly sliding from her bed. Her satin PJ's were changed to a different texture, less slippery. She requires frequent checks. A sitter could be helpful but she woul not tolerate someone in her room all day. She barely tolerates short visits. Will continue to monitor from palliative care perspective. 2. ACP: completed on previous visit with [facility]. Comfort measures desired.   I spent 15 minutes providing this consultation,  from 11:00 am to 11:15 am. More than 50% of the time in this consultation was spent interviewing staff, reviewing the chart, assessing patient and  coordinating communication.   HISTORY OF PRESENT ILLNESS:  Frances Jackson is a 84 y.o.  female with multiple medical problems including CHF, memory loss and abnormal gait. Palliative Care was asked to help address symptom management and goals of care. Today's visit is just routine to assess for any decline.   CODE STATUS: DNR  PPS: weak 50% HOSPICE ELIGIBILITY/DIAGNOSIS: TBD  PAST MEDICAL HISTORY:  Past Medical History:  Diagnosis Date  . Anxiety   . Arthritis   . Dementia   . Edema 05/08/2015  . Essential hypertension 05/08/2015  . Hypertension     SOCIAL HX:  Social History   Tobacco Use  . Smoking status: Never Smoker  . Smokeless tobacco: Never Used  Substance Use Topics  . Alcohol use: Yes    Alcohol/week: 16.8 oz    Types: 28 Glasses of wine per week    ALLERGIES: No Known Allergies     PERTINENT MEDICATIONS:  Outpatient Encounter Medications as of 08/17/2017  Medication Sig  . acetaminophen (TYLENOL) 325 MG tablet Take 650 mg by mouth 3 (three) times daily.  Marland Kitchen acetaminophen (TYLENOL) 500 MG tablet Take 500 mg by mouth every 12 (twelve) hours as needed for mild pain.  Marland Kitchen amLODipine (NORVASC) 5 MG tablet Take 5 mg by mouth daily.  . calcium carbonate (ANTACID) 500 MG chewable tablet Chew 1 tablet by mouth 2 (two) times daily.  . clonazePAM (KLONOPIN) 0.5 MG tablet Take 0.5 mg by mouth 2 (two) times daily as needed for anxiety.  . Cranberry 425 MG CAPS Take 1 capsule by mouth daily.  . furosemide (LASIX) 20 MG tablet Take 20 mg by mouth daily.   Marland Kitchen HYDROcodone-acetaminophen (NORCO/VICODIN) 5-325 MG tablet Take 1 tablet by mouth every 6 (six) hours as needed for moderate pain.  Marland Kitchen levothyroxine (SYNTHROID, LEVOTHROID) 50 MCG tablet Take 50 mcg by mouth daily before breakfast.  . losartan (COZAAR) 25 MG tablet Take 1 tablet (25 mg total) by mouth daily.  . mineral oil external liquid Place 2 drops into the right ear once a week.  . NON FORMULARY Take 6 oz by mouth 2 (two) times daily. 6 oz of wine.  . nortriptyline (PAMELOR) 10 MG capsule Take 10 mg by mouth at bedtime.  . simvastatin (ZOCOR) 20 MG tablet Take 20 mg by mouth at bedtime.   No facility-administered encounter medications on file  as of 08/17/2017.     PHYSICAL EXAM:   Vitals: May 8th: 137.2# 97.2, 88, 20, 120/62  3 # wt loss General: Alert, elderly Caucasian female sitting in community area watching TV Cardiovascular: Irreg rhythm; reg rate Pulmonary: CTAB Abdomen: Soft, active BS, NTTP  Extremities: weak legs with walking; right wrist in brace Skin: fragile Neurological: alert, HOH; irritable; +memory loss  Truett Perna, NP

## 2017-09-24 ENCOUNTER — Non-Acute Institutional Stay: Payer: Self-pay | Admitting: Hospice and Palliative Medicine

## 2017-09-24 DIAGNOSIS — Z515 Encounter for palliative care: Secondary | ICD-10-CM

## 2017-09-25 NOTE — Progress Notes (Signed)
    PALLIATIVE CARE CONSULT VISIT   PATIENT NAME: Newton PiggMary Ann Greenleaf DOB: 04/08/1921 MRN: [id number]  PRIMARY CARE PROVIDER:   System, Pcp Not In  REFERRING PROVIDER:  NP Cyndie ChimeNguyen, Dr. Brooke DareKing  RESPONSIBLE PARTY:   Mickey FarberDonna McCaw, daughter [phone]) [phone]  ASSESSMENT:  Case discussed with staff. Patient has been relatively stable without significant changes. Oral intake is adequate. Patient appears comfortable. No recent infections or hospitalizations.   Consider hospice in the event of persistent decline. MOST form on chart.   RECOMMENDATIONS and PLAN:  1. Will follow  I spent 15 minutes providing this consultation,  from 1300 to 1315. More than 50% of the time in this consultation was spent coordinating communication.   HISTORY OF PRESENT ILLNESS:  Newton PiggMary Ann Liddy is a 84 y.o. year old female with multiple medical problems including h/o CVA, vascular dementia. Palliative Care was asked to help address goals of care. Routine follow up visit made today  CODE STATUS: DNR  PPS: 40% HOSPICE ELIGIBILITY/DIAGNOSIS: TBD  PAST MEDICAL HISTORY:  Past Medical History:  Diagnosis Date  . Anxiety   . Arthritis   . Dementia   . Edema 05/08/2015  . Essential hypertension 05/08/2015  . Hypertension     SOCIAL HX:  Social History   Tobacco Use  . Smoking status: Never Smoker  . Smokeless tobacco: Never Used  Substance Use Topics  . Alcohol use: Yes    Alcohol/week: 16.8 oz    Types: 28 Glasses of wine per week    ALLERGIES: No Known Allergies   PERTINENT MEDICATIONS:  Outpatient Encounter Medications as of 09/24/2017  Medication Sig  . acetaminophen (TYLENOL) 325 MG tablet Take 650 mg by mouth 3 (three) times daily.  Marland Kitchen. acetaminophen (TYLENOL) 500 MG tablet Take 500 mg by mouth every 12 (twelve) hours as needed for mild pain.  Marland Kitchen. amLODipine (NORVASC) 5 MG tablet Take 5 mg by mouth daily.  . calcium carbonate (ANTACID) 500 MG chewable tablet Chew 1 tablet by mouth 2 (two) times daily.    . clonazePAM (KLONOPIN) 0.5 MG tablet Take 0.5 mg by mouth 2 (two) times daily as needed for anxiety.  . Cranberry 425 MG CAPS Take 1 capsule by mouth daily.  . furosemide (LASIX) 20 MG tablet Take 20 mg by mouth daily.   Marland Kitchen. HYDROcodone-acetaminophen (NORCO/VICODIN) 5-325 MG tablet Take 1 tablet by mouth every 6 (six) hours as needed for moderate pain.  Marland Kitchen. levothyroxine (SYNTHROID, LEVOTHROID) 50 MCG tablet Take 50 mcg by mouth daily before breakfast.  . losartan (COZAAR) 25 MG tablet Take 1 tablet (25 mg total) by mouth daily.  . mineral oil external liquid Place 2 drops into the right ear once a week.  . NON FORMULARY Take 6 oz by mouth 2 (two) times daily. 6 oz of wine.  . nortriptyline (PAMELOR) 10 MG capsule Take 10 mg by mouth at bedtime.  . simvastatin (ZOCOR) 20 MG tablet Take 20 mg by mouth at bedtime.   No facility-administered encounter medications on file as of 09/24/2017.     PHYSICAL EXAM:   General: NAD, frail appearing, thin Pulmonary: unlabored Extremities: no edema Skin: no rashes Neurological: Weakness, confusion, speech clear  Malachy MoanJOSHUA R BORDERS, NP

## 2018-02-08 ENCOUNTER — Non-Acute Institutional Stay: Payer: Medicare Other | Admitting: Internal Medicine

## 2018-02-08 ENCOUNTER — Encounter: Payer: Self-pay | Admitting: Internal Medicine

## 2018-02-08 VITALS — BP 146/96 | HR 68 | Resp 12 | Ht 63.0 in | Wt 142.8 lb

## 2018-02-08 DIAGNOSIS — I10 Essential (primary) hypertension: Secondary | ICD-10-CM

## 2018-02-08 NOTE — Progress Notes (Addendum)
[facility] Telephone: [phone](336) [phone] Fax: [phone](336) [phone]  PATIENT NAME: Frances PiggMary Ann Rinkenberger DOB: 09/28/1921 MRN: [id number]  PRIMARY CARE PROVIDER:  NP Cyndie ChimeNguyen, Dr. Brooke DareKing  RESPONSIBLE PARTY:   Mickey FarberDonna McCaw, daughter [phone]  ASSESSMENT / PLAN:  1. HTN: elevated BP. On very low dose diuretic 10mg  qd with 10 MEQ potassium qd. Was on Norvasc in the past with side effect lower extremity swelling.Has been on Losartin in the past..  -Follow BP measurements; consider medication adjustment upwards to maintain BP 120's/80's.  2. Advanced Care Directives: DNR and MOST form on chart: DND/DNI,comfort measures, antibiotics if indicated, IVFs for a defined trial period, no feeding tube  I spent 15 min minutes providing this consultation,  from 4:30pm to 4:45pm. More than 50% of the time in this consultation was spent coordinating communication.   HISTORY OF PRESENT ILLNESS:  Frances Jackson is a 84 y.o. year old female with h/o HTN and dementia.  Palliative Care was asked to continue to follow along for ongoing symptom management and goals of care.  CODE STATUS: DNR  ROS: PPS: 60%.Patient is alert and pleasantly conversant though HOH. Her weight has been relatively stable (Oct weight 142.8lbs, up 5.6 lbs over 6 months). Her height is 5'3" for a BMI of 25.3kg/m2. She denies pain, is transfering and  ambulating about independently. No further falls since July. She denies DOE.She appears in good spirits. She is alert and oriented x 3.  HOSPICE ELIGIBILITY/DIAGNOSIS: no, as prognosis appears greater than 6 months.  PAST MEDICAL HISTORY:  Past Medical History:  Diagnosis Date  . Anxiety   . Arthritis   . Dementia (HCC)   . Edema 05/08/2015  . Essential hypertension 05/08/2015  . Hypertension     SOCIAL HX:  Social History   Tobacco Use  . Smoking status: Never Smoker  . Smokeless tobacco: Never Used  Substance Use Topics  . Alcohol use: Yes    Alcohol/week: 28.0 standard  drinks    Types: 28 Glasses of wine per week    ALLERGIES: No Known Allergies   PERTINENT MEDICATIONS:  Outpatient Encounter Medications as of 02/08/2018  Medication Sig  . acetaminophen (TYLENOL) 325 MG tablet Take 650 mg by mouth 1 day or 1 dose.   . cholecalciferol (VITAMIN D3) 25 MCG (1000 UT) tablet Take 1,000 Units by mouth daily.  . Cranberry 425 MG CAPS Take 1 capsule by mouth daily.  . furosemide (LASIX) 20 MG tablet Take 20 mg by mouth daily. 0.5mg  tab once a day  . mineral oil external liquid Place 2 drops into the right ear once a week.  . NON FORMULARY Take 6 oz by mouth 2 (two) times daily. 6 oz of wine.  Jeralyn Bennett. Oyster Shell 500 MG TABS Take 500 mg by mouth daily.  . potassium chloride (K-DUR) 10 MEQ tablet Take 10 mEq by mouth daily.  . [DISCONTINUED] acetaminophen (TYLENOL) 500 MG tablet Take 500 mg by mouth every 12 (twelve) hours as needed for mild pain.  . [DISCONTINUED] amLODipine (NORVASC) 5 MG tablet Take 5 mg by mouth daily.  . [DISCONTINUED] calcium carbonate (ANTACID) 500 MG chewable tablet Chew 1 tablet by mouth 2 (two) times daily.  . [DISCONTINUED] clonazePAM (KLONOPIN) 0.5 MG tablet Take 0.5 mg by mouth 2 (two) times daily as needed for anxiety.  . [DISCONTINUED] HYDROcodone-acetaminophen (NORCO/VICODIN) 5-325 MG tablet Take 1 tablet by mouth every 6 (six) hours as needed for moderate pain.  . [DISCONTINUED] levothyroxine (SYNTHROID, LEVOTHROID) 50 MCG tablet  Take 50 mcg by mouth daily before breakfast.  . [DISCONTINUED] losartan (COZAAR) 25 MG tablet Take 1 tablet (25 mg total) by mouth daily.  . [DISCONTINUED] nortriptyline (PAMELOR) 10 MG capsule Take 10 mg by mouth at bedtime.  . [DISCONTINUED] simvastatin (ZOCOR) 20 MG tablet Take 20 mg by mouth at bedtime.   No facility-administered encounter medications on file as of 02/08/2018.     PHYSICAL EXAM:  VS: BP 146/96, HR: 68, RR 12 General: NAD, slender Caucasian female who is alert and pleasantly  conversant Cardiovascular: regular rate and rhythm Pulmonary: clear ant fields Abdomen: soft, nontender, + bowel sounds GU: no suprapubic tenderness Extremities: no edema, no joint deformities Skin: no rashes Neurological: Weakness but otherwise nonfocal  Anselm Lis, NP

## 2018-02-11 NOTE — Addendum Note (Signed)
Addended by: Holly BodilySERPE, [place] on: 02/11/2018 09:50 PM   Modules accepted: Orders

## 2018-03-22 ENCOUNTER — Encounter: Payer: Self-pay | Admitting: Internal Medicine

## 2018-03-22 ENCOUNTER — Non-Acute Institutional Stay: Payer: Medicare Other | Admitting: Internal Medicine

## 2018-03-22 VITALS — BP 138/82 | HR 70 | Temp 98.4°F | Resp 18

## 2018-03-22 DIAGNOSIS — Z515 Encounter for palliative care: Secondary | ICD-10-CM

## 2018-03-22 NOTE — Progress Notes (Signed)
[facility] Telephone: [phone]) [phone] Fax: [phone](336) [phone]  PATIENT NAME: Frances PiggMary Ann Jackson DOB: 03/18/1922 MRN: [id number] [facility]  PRIMARY CARE PROVIDER:  NP Cyndie ChimeNguyen, Dr. Brooke DareKing  RESPONSIBLE PARTY:   Frances Jackson, daughter [phone]  HISTORY OF PRESENT ILLNESS:  Frances Jackson is a 84 y.o. female with h/o HTN and dementia.  Palliative Care was asked to continue to follow along for ongoing symptom management and goals of care. This is a routine f/u visit from 02/08/2018.    ASSESSMENT / PLAN:  1. HTN: Runs high after activities such as showering (166/88). On very low dose diuretic 10mg  qd with 10 MEQ potassium qd. Was on Norvasc in the past with side effect lower extremity swelling.Has been on Losartin in the past..             -Follow BP measurements; consider medication adjustment upwards to maintain BP 120's/80's.  2. Advanced Care Directives: DNR and MOST form on chart. I placed a TC to daughter Frances Jackson with updates.  I spent 15 min minutes providing this consultation,  from 1:30pm to 1:45pm. More than 50% of the time in this consultation was spent coordinating communication.   CODE STATUS: DNR. MOST: DND/DNI,comfort measures, antibiotics if indicated, IVFs for a defined trial period, no feeding tube  ROS: PPS: 60%.Patient is alert and pleasantly conversant though HOH. Her weight has been relatively stable (Dec weight 139.8lbs) these last four months, but down about 10 lbs from 8 months ago. Staff report good appetite and 100% oral intake of meals. Her height is 5'3" for a BMI of 24.8kg/m2. She denies pain, is transfering and  ambulating about independently with cane. Staff report she is increasingly incontinent. No further falls since July. She denies DOE.She appears in good spirits. She is alert and oriented x 3. She has been having some loss of her right upper front teeth, but she is able to eat without problems and denies gum soreness. Her  daughter reports bringing patient to the dentist would be very difficult.   HOSPICE ELIGIBILITY/DIAGNOSIS: no, as prognosis appears greater than 6 months.  PAST MEDICAL HISTORY:  Past Medical History:  Diagnosis Date  . Anxiety   . Arthritis   . Dementia (HCC)   . Edema 05/08/2015  . Essential hypertension 05/08/2015  . Hypertension     SOCIAL HX:  Social History   Tobacco Use  . Smoking status: Never Smoker  . Smokeless tobacco: Never Used  Substance Use Topics  . Alcohol use: Yes    Alcohol/week: 28.0 standard drinks    Types: 28 Glasses of wine per week    ALLERGIES: No Known Allergies   PERTINENT MEDICATIONS:  Outpatient Encounter Medications as of 03/22/2018  Medication Sig  . acetaminophen (TYLENOL) 325 MG tablet Take 650 mg by mouth daily.   . cholecalciferol (VITAMIN D3) 25 MCG (1000 UT) tablet Take 1,000 Units by mouth daily.  . Cranberry 425 MG CAPS Take 1 capsule by mouth daily.  . furosemide (LASIX) 20 MG tablet Take 20 mg by mouth daily. 0.5mg  tab once a day  . mineral oil external liquid Place 2 drops into the right ear once a week.  . NON FORMULARY Take 2.5 oz by mouth 2 (two) times daily. 2.5 oz wine at 4pm and 9pm if requested by patient  . Oyster Shell 500 MG TABS Take 500 mg by mouth daily.  . potassium chloride (K-DUR) 10 MEQ tablet Take 10 mEq by mouth daily.  No facility-administered encounter medications on file as of 03/22/2018.     PHYSICAL EXAM:   General: NAD, frail appearing, thin Cardiovascular: regular rate and rhythm Pulmonary: clear ant fields Abdomen: soft, nontender, + bowel sounds GU: no suprapubic tenderness Extremities: no edema, no joint deformities Skin: no rashes Neurological: Weakness but otherwise nonfocal  Anselm LisMary P Zakariyah Freimark, NP

## 2018-07-28 ENCOUNTER — Emergency Department (HOSPITAL_COMMUNITY): Payer: Medicare Other

## 2018-07-28 ENCOUNTER — Inpatient Hospital Stay (HOSPITAL_COMMUNITY)
Admission: EM | Admit: 2018-07-28 | Discharge: 2018-08-26 | DRG: 871 | Disposition: E | Payer: Medicare Other | Attending: Family Medicine | Admitting: Family Medicine

## 2018-07-28 ENCOUNTER — Encounter (HOSPITAL_COMMUNITY): Payer: Self-pay | Admitting: Emergency Medicine

## 2018-07-28 DIAGNOSIS — J9602 Acute respiratory failure with hypercapnia: Secondary | ICD-10-CM | POA: Diagnosis present

## 2018-07-28 DIAGNOSIS — J189 Pneumonia, unspecified organism: Secondary | ICD-10-CM | POA: Diagnosis present

## 2018-07-28 DIAGNOSIS — E875 Hyperkalemia: Secondary | ICD-10-CM | POA: Diagnosis present

## 2018-07-28 DIAGNOSIS — F0390 Unspecified dementia without behavioral disturbance: Secondary | ICD-10-CM | POA: Diagnosis present

## 2018-07-28 DIAGNOSIS — R0602 Shortness of breath: Secondary | ICD-10-CM | POA: Diagnosis not present

## 2018-07-28 DIAGNOSIS — M1990 Unspecified osteoarthritis, unspecified site: Secondary | ICD-10-CM | POA: Diagnosis present

## 2018-07-28 DIAGNOSIS — I10 Essential (primary) hypertension: Secondary | ICD-10-CM | POA: Diagnosis present

## 2018-07-28 DIAGNOSIS — Z66 Do not resuscitate: Secondary | ICD-10-CM | POA: Diagnosis present

## 2018-07-28 DIAGNOSIS — I110 Hypertensive heart disease with heart failure: Secondary | ICD-10-CM | POA: Diagnosis present

## 2018-07-28 DIAGNOSIS — N179 Acute kidney failure, unspecified: Secondary | ICD-10-CM | POA: Diagnosis present

## 2018-07-28 DIAGNOSIS — J690 Pneumonitis due to inhalation of food and vomit: Secondary | ICD-10-CM | POA: Diagnosis present

## 2018-07-28 DIAGNOSIS — J9601 Acute respiratory failure with hypoxia: Secondary | ICD-10-CM | POA: Diagnosis present

## 2018-07-28 DIAGNOSIS — Y95 Nosocomial condition: Secondary | ICD-10-CM | POA: Diagnosis present

## 2018-07-28 DIAGNOSIS — F419 Anxiety disorder, unspecified: Secondary | ICD-10-CM | POA: Diagnosis present

## 2018-07-28 DIAGNOSIS — Z79899 Other long term (current) drug therapy: Secondary | ICD-10-CM

## 2018-07-28 DIAGNOSIS — A419 Sepsis, unspecified organism: Secondary | ICD-10-CM | POA: Diagnosis not present

## 2018-07-28 DIAGNOSIS — Z515 Encounter for palliative care: Secondary | ICD-10-CM

## 2018-07-28 DIAGNOSIS — R945 Abnormal results of liver function studies: Secondary | ICD-10-CM | POA: Diagnosis present

## 2018-07-28 DIAGNOSIS — Z20828 Contact with and (suspected) exposure to other viral communicable diseases: Secondary | ICD-10-CM | POA: Diagnosis present

## 2018-07-28 DIAGNOSIS — R159 Full incontinence of feces: Secondary | ICD-10-CM | POA: Diagnosis present

## 2018-07-28 DIAGNOSIS — I5032 Chronic diastolic (congestive) heart failure: Secondary | ICD-10-CM | POA: Diagnosis present

## 2018-07-28 DIAGNOSIS — E860 Dehydration: Secondary | ICD-10-CM | POA: Diagnosis present

## 2018-07-28 DIAGNOSIS — R7989 Other specified abnormal findings of blood chemistry: Secondary | ICD-10-CM | POA: Diagnosis present

## 2018-07-28 DIAGNOSIS — R230 Cyanosis: Secondary | ICD-10-CM | POA: Diagnosis present

## 2018-07-28 LAB — CBC WITH DIFFERENTIAL/PLATELET
Abs Immature Granulocytes: 0.03 10*3/uL (ref 0.00–0.07)
Basophils Absolute: 0 10*3/uL (ref 0.0–0.1)
Basophils Relative: 0 %
Eosinophils Absolute: 0 10*3/uL (ref 0.0–0.5)
Eosinophils Relative: 0 %
HCT: 46.9 % — ABNORMAL HIGH (ref 36.0–46.0)
Hemoglobin: 15 g/dL (ref 12.0–15.0)
Immature Granulocytes: 0 %
Lymphocytes Relative: 12 %
Lymphs Abs: 1.4 10*3/uL (ref 0.7–4.0)
MCH: 31.1 pg (ref 26.0–34.0)
MCHC: 32 g/dL (ref 30.0–36.0)
MCV: 97.1 fL (ref 80.0–100.0)
Monocytes Absolute: 0.3 10*3/uL (ref 0.1–1.0)
Monocytes Relative: 3 %
Neutro Abs: 9.6 10*3/uL — ABNORMAL HIGH (ref 1.7–7.7)
Neutrophils Relative %: 85 %
Platelets: 326 10*3/uL (ref 150–400)
RBC: 4.83 MIL/uL (ref 3.87–5.11)
RDW: 13.9 % (ref 11.5–15.5)
WBC: 11.3 10*3/uL — ABNORMAL HIGH (ref 4.0–10.5)
nRBC: 0 % (ref 0.0–0.2)

## 2018-07-28 LAB — COMPREHENSIVE METABOLIC PANEL
ALT: 17 U/L (ref 0–44)
AST: 88 U/L — ABNORMAL HIGH (ref 15–41)
Albumin: 3.9 g/dL (ref 3.5–5.0)
Alkaline Phosphatase: 55 U/L (ref 38–126)
Anion gap: 17 — ABNORMAL HIGH (ref 5–15)
BUN: 19 mg/dL (ref 8–23)
CO2: 16 mmol/L — ABNORMAL LOW (ref 22–32)
Calcium: 8.5 mg/dL — ABNORMAL LOW (ref 8.9–10.3)
Chloride: 99 mmol/L (ref 98–111)
Creatinine, Ser: 1.17 mg/dL — ABNORMAL HIGH (ref 0.44–1.00)
GFR calc Af Amer: 46 mL/min — ABNORMAL LOW (ref >60)
GFR calc non Af Amer: 40 mL/min — ABNORMAL LOW (ref >60)
Glucose, Bld: 234 mg/dL — ABNORMAL HIGH (ref 70–99)
Potassium: 5.7 mmol/L — ABNORMAL HIGH (ref 3.5–5.1)
Sodium: 132 mmol/L — ABNORMAL LOW (ref 135–145)
Total Bilirubin: 2.7 mg/dL — ABNORMAL HIGH (ref 0.3–1.2)
Total Protein: 7 g/dL (ref 6.5–8.1)

## 2018-07-28 LAB — LACTIC ACID, PLASMA: Lactic Acid, Venous: 5.3 mmol/L (ref 0.5–1.9)

## 2018-07-28 LAB — TROPONIN I: Troponin I: <0.03 ng/mL (ref <0.03)

## 2018-07-28 MED ORDER — SODIUM CHLORIDE 0.9 % IV BOLUS (SEPSIS)
1000.0000 mL | Freq: Once | INTRAVENOUS | Status: AC
  Administered 2018-07-29: 1000 mL via INTRAVENOUS

## 2018-07-28 MED ORDER — SODIUM CHLORIDE 0.9 % IV SOLN: 2.0000 g | INTRAVENOUS | Status: DC

## 2018-07-28 MED ORDER — ACETAMINOPHEN 500 MG PO TABS: 1000.0000 mg | ORAL_TABLET | Freq: Once | ORAL | Status: DC

## 2018-07-28 MED ORDER — VANCOMYCIN HCL IN DEXTROSE 1-5 GM/200ML-% IV SOLN: 1000.0000 mg | Freq: Once | INTRAVENOUS | Status: DC

## 2018-07-28 MED ORDER — METRONIDAZOLE IN NACL 5-0.79 MG/ML-% IV SOLN
500.0000 mg | Freq: Once | INTRAVENOUS | Status: AC
  Administered 2018-07-29: 500 mg via INTRAVENOUS
  Filled 2018-07-28 (×2): qty 100

## 2018-07-28 MED ORDER — VANCOMYCIN HCL 10 G IV SOLR
1500.0000 mg | Freq: Once | INTRAVENOUS | Status: AC
  Administered 2018-07-29: 1500 mg via INTRAVENOUS
  Filled 2018-07-28: qty 1500

## 2018-07-28 MED ORDER — VANCOMYCIN HCL IN DEXTROSE 750-5 MG/150ML-% IV SOLN
750.0000 mg | INTRAVENOUS | Status: DC
  Filled 2018-07-28: qty 150

## 2018-07-28 MED ORDER — ACETAMINOPHEN 650 MG RE SUPP
650.0000 mg | Freq: Once | RECTAL | Status: AC
  Administered 2018-07-29: 650 mg via RECTAL
  Filled 2018-07-28: qty 1

## 2018-07-28 MED ORDER — SODIUM CHLORIDE 0.9 % IV SOLN
2.0000 g | Freq: Once | INTRAVENOUS | Status: AC
  Administered 2018-07-28: 2 g via INTRAVENOUS
  Filled 2018-07-28: qty 2

## 2018-07-28 MED ORDER — SODIUM CHLORIDE 0.9% FLUSH
3.0000 mL | Freq: Once | INTRAVENOUS | Status: AC
  Administered 2018-07-29: 3 mL via INTRAVENOUS

## 2018-07-28 NOTE — ED Triage Notes (Signed)
Brought by ems from [facility].  Per staff was good at 8pm.  At 2130 found lying in bed with vomit on mouth and incontinent of stool.  Also reports lips and fingers were cyanotic.  Initial sat reported at 60% on room air.  Up to 90 after giving ntg 4 sl en route and placed on non rebreather.  Initial BP per ems was 223/110.  Down to 130/64 after ntg.

## 2018-07-28 NOTE — Progress Notes (Addendum)
Pharmacy Antibiotic Note  Frances Jackson is a 85 y.o. female admitted on 07/27/2018 with sepsis.  Pharmacy has been consulted for vancomycin and cefepime dosing. SCr 1.17 (BL <1), CrCl ~ 26 ml/min  Plan: Vancomycin 1500mg  IV x1, then 750mg  IV every 24 hours Cefepime 2g IV every 24 hours Monitor renal function, Cx and clinical progression to narrow Vancomycin levels at steady state  Height: 5\' 3"  (160 cm) Weight: 142 lb 13.7 oz (64.8 kg) IBW/kg (Calculated) : 52.4  Temp (24hrs), Avg:101.1 F (38.4 C), Min:101.1 F (38.4 C), Max:101.1 F (38.4 C)  No results for input(s): WBC, CREATININE, LATICACIDVEN, VANCOTROUGH, VANCOPEAK, VANCORANDOM, GENTTROUGH, GENTPEAK, GENTRANDOM, TOBRATROUGH, TOBRAPEAK, TOBRARND, AMIKACINPEAK, AMIKACINTROU, AMIKACIN in the last 168 hours.  CrCl cannot be calculated (Patient's most recent lab result is older than the maximum 21 days allowed.).    No Known Allergies  Antimicrobials this admission: vanc 5/3>> Cefepime 5/3>> Flagyl x1  Dose adjustments this admission: n/a  Microbiology results: 5/3 BCx: sent 5/3 COVID: sent   Addendum: Now to switch to zosyn Zosyn 3.375g IV every 8 hours  Daylene Posey, PharmD Clinical Pharmacist Please check AMION for all [facility] Pharmacy numbers 08/22/2018 11:17 PM

## 2018-07-28 NOTE — ED Provider Notes (Signed)
[facility] EMERGENCY DEPARTMENT Provider Note   CSN: [id number] Arrival date & time: 2018/08/24  2229    History   Chief Complaint Chief Complaint  Patient presents with  . Shortness of Breath    HPI Frances Jackson is a 85 y.o. female.   The history is provided by the EMS personnel and the nursing home. The history is limited by the condition of the patient (Severe respiratory distress).  Shortness of Breath  She has history of hypertension and is currently under palliative care and is DNR/DNI.  At nursing home, she was found at 2130 with vomit in her mouth and was incontinent of stool and was noted to have cyanotic lips and fingers.  Oxygen saturation was 60% on room air which improved to 90% after giving sublingual nitroglycerin and being placed on a nonrebreather mask.  Patient was noted to be extremely hypertensive initially but that has come down with nitroglycerin and oxygen.  Patient is not able to give any history.  Past Medical History:  Diagnosis Date  . Anxiety   . Arthritis   . Dementia (HCC)   . Edema 05/08/2015  . Essential hypertension 05/08/2015  . Hypertension     Patient Active Problem List   Diagnosis Date Noted  . Palliative care encounter 08/17/2017  . Edema 05/08/2015  . Essential hypertension 05/08/2015  . Lactic acidosis 02/06/2015  . UTI (lower urinary tract infection) 02/06/2015    Past Surgical History:  Procedure Laterality Date  . arm surgery       OB History   No obstetric history on file.      Home Medications    Prior to Admission medications   Medication Sig Start Date End Date Taking? Authorizing Provider  acetaminophen (TYLENOL) 325 MG tablet Take 650 mg by mouth daily.     [provider]  cholecalciferol (VITAMIN D3) 25 MCG (1000 UT) tablet Take 1,000 Units by mouth daily.    [provider]  Cranberry 425 MG CAPS Take 1 capsule by mouth daily.    [provider]  furosemide  (LASIX) 20 MG tablet Take 20 mg by mouth daily. 0.5mg  tab once a day    [provider]  mineral oil external liquid Place 2 drops into the right ear once a week.    [provider]  NON FORMULARY Take 2.5 oz by mouth 2 (two) times daily. 2.5 oz wine at 4pm and 9pm if requested by patient    [provider]  Oyster Shell 500 MG TABS Take 500 mg by mouth daily.    [provider]  potassium chloride (K-DUR) 10 MEQ tablet Take 10 mEq by mouth daily.    [provider]    Family History No family history on file.  Social History Social History   Tobacco Use  . Smoking status: Never Smoker  . Smokeless tobacco: Never Used  Substance Use Topics  . Alcohol use: Yes    Alcohol/week: 28.0 standard drinks    Types: 28 Glasses of wine per week  . Drug use: No     Allergies   Patient has no known allergies.   Review of Systems Review of Systems  Unable to perform ROS: Severe respiratory distress  Respiratory: Positive for shortness of breath.      Physical Exam Updated Vital Signs BP 108/61   Pulse (!) 131   Temp (!) 101.1 F (38.4 C) (Rectal)   Resp (!) 23   Ht  5\' 3"  (1.6 m)   Wt 64.8 kg   SpO2 96%   BMI 25.31 kg/m   Physical Exam Vitals signs and nursing note reviewed.    85 year old female, resting comfortably and in no acute distress. Vital signs are significant for fever, rapid heart rate, rapid respiratory rate. Oxygen saturation is 96%, which is normal, but was only achieved by getting oxygen via a nonrebreather mask. Head is normocephalic and atraumatic. PERRLA, EOMI. Oropharynx is clear. Neck is nontender and supple without adenopathy or JVD. Back is nontender and there is no CVA tenderness. Lungs have coarse breath sounds throughout with bibasilar rales and diffuse inspiratory and expiratory rhonchi and scattered wheezes. Chest is nontender. Heart is tachycardic without murmur. Abdomen is soft, flat, nontender  without masses or hepatosplenomegaly and peristalsis is normoactive. Extremities have no cyanosis or edema, full range of motion is present. Skin is warm and dry without rash. Neurologic: Awake but nonconversant, cranial nerves are intact, there are no motor or sensory deficits.  ED Treatments / Results  Labs (all labs ordered are listed, but only abnormal results are displayed) Labs Reviewed  COMPREHENSIVE METABOLIC PANEL - Abnormal; Notable for the following components:      Result Value   Sodium 132 (*)    Potassium 5.7 (*)    CO2 16 (*)    Glucose, Bld 234 (*)    Creatinine, Ser 1.17 (*)    Calcium 8.5 (*)    AST 88 (*)    Total Bilirubin 2.7 (*)    GFR calc non Af Amer 40 (*)    GFR calc Af Amer 46 (*)    Anion gap 17 (*)    All other components within normal limits  LACTIC ACID, PLASMA - Abnormal; Notable for the following components:   Lactic Acid, Venous 5.3 (*)    All other components within normal limits  CBC WITH DIFFERENTIAL/PLATELET - Abnormal; Notable for the following components:   WBC 11.3 (*)    HCT 46.9 (*)    Neutro Abs 9.6 (*)    All other components within normal limits  CULTURE, BLOOD (ROUTINE X 2)  CULTURE, BLOOD (ROUTINE X 2)  SARS CORONAVIRUS 2 (HOSPITAL ORDER, PERFORMED IN [facility])  TROPONIN I  LACTIC ACID, PLASMA  URINALYSIS, ROUTINE W REFLEX MICROSCOPIC  PROTIME-INR  D-DIMER, QUANTITATIVE (NOT [facility])  FERRITIN  FIBRINOGEN  C-REACTIVE PROTEIN  LACTATE DEHYDROGENASE  PROCALCITONIN  TRIGLYCERIDES    EKG EKG Interpretation  Date/Time:  Sunday Jul 28 2018 22:39:42 EDT Ventricular Rate:  136 PR Interval:    QRS Duration: 76 QT Interval:  291 QTC Calculation: 438 R Axis:   -15 Text Interpretation:  Sinus tachycardia Consider right atrial enlargement Borderline left axis deviation Repolarization abnormality, prob rate related When compared with ECG of 03/07/2016, HEART RATE has increased ST depression in multip;le leads  is now present - possibly rate-related Confirmed by Dione BoozeGlick, Abie Killian ([id number]) on 07/31/2018 11:02:48 PM   Radiology Dg Chest Port 1 View  Result Date: 07/26/2018 CLINICAL DATA:  Fever EXAM: PORTABLE CHEST 1 VIEW COMPARISON:  02/06/2015 FINDINGS: Bilateral airspace disease, most confluent in the right upper lobe and diffuse on the left concerning for pneumonia. No effusions. Heart is mildly enlarged. No acute bony abnormality. IMPRESSION: Patchy bilateral airspace opacities, most confluent in the right upper lobe, rather diffuse on the left. Findings concerning for pneumonia. COVID-19 cannot be excluded. Electronically Signed   By: Charlett NoseKevin  Dover M.D.   On: 08/06/2018  23:42    Procedures Procedures  CRITICAL CARE Performed by: Dione Booze Total critical care time: 60 minutes Critical care time was exclusive of separately billable procedures and treating other patients. Critical care was necessary to treat or prevent imminent or life-threatening deterioration. Critical care was time spent personally by me on the following activities: development of treatment plan with patient and/or surrogate as well as nursing, discussions with consultants, evaluation of patient's response to treatment, examination of patient, obtaining history from patient or surrogate, ordering and performing treatments and interventions, ordering and review of laboratory studies, ordering and review of radiographic studies, pulse oximetry and re-evaluation of patient's condition.  Medications Ordered in ED Medications  sodium chloride flush (NS) 0.9 % injection 3 mL (has no administration in time range)  acetaminophen (TYLENOL) tablet 1,000 mg (has no administration in time range)  ceFEPIme (MAXIPIME) 2 g in sodium chloride 0.9 % 100 mL IVPB (has no administration in time range)  metroNIDAZOLE (FLAGYL) IVPB 500 mg (has no administration in time range)  vancomycin (VANCOCIN) IVPB 1000 mg/200 mL premix (has no administration in time  range)     Initial Impression / Assessment and Plan / ED Course  I have reviewed the triage vital signs and the nursing notes.  Pertinent labs & imaging results that were available during my care of the patient were reviewed by me and considered in my medical decision making (see chart for details).  Acute respiratory distress.  Exam is suggestive of pulmonary edema, but with fever need to consider pneumonia as well as COVID-19.  ECG does show some ST depression which is probably rate related.  Old records are reviewed confirming ongoing palliative care.  She did have a cardiology evaluation for dyspnea on exertion, and echocardiogram did show grade 1 diastolic dysfunction with ejection fraction 60-65%.  She has been started on sepsis pathway and empiric antibiotics will be given.  Screening for COVID-19 is also initiated.  Chest x-ray shows multifocal pneumonia.  She has remained hemodynamically stable.  Labs do show a slight increase in creatinine over baseline with low CO2.  Lactic acid level is elevated which accounts for most of the low CO2.  WBC is mildly elevated with a left shift.  COVID 19 test and associated blood tests are still pending at this time.  Case is discussed with Dr. Clyde Lundborg of [facility], who agrees to admit the patient.  Final Clinical Impressions(s) / ED Diagnoses   Final diagnoses:  Sepsis, due to unspecified organism, unspecified whether acute organ dysfunction present [facility])  Community acquired pneumonia, unspecified laterality    ED Discharge Orders    None       Dione Booze, MD 07/29/18 0040

## 2018-07-29 DIAGNOSIS — R0602 Shortness of breath: Secondary | ICD-10-CM | POA: Diagnosis present

## 2018-07-29 DIAGNOSIS — E875 Hyperkalemia: Secondary | ICD-10-CM | POA: Diagnosis present

## 2018-07-29 DIAGNOSIS — J690 Pneumonitis due to inhalation of food and vomit: Secondary | ICD-10-CM | POA: Diagnosis present

## 2018-07-29 DIAGNOSIS — Y95 Nosocomial condition: Secondary | ICD-10-CM | POA: Diagnosis present

## 2018-07-29 DIAGNOSIS — J9601 Acute respiratory failure with hypoxia: Secondary | ICD-10-CM | POA: Diagnosis present

## 2018-07-29 DIAGNOSIS — I110 Hypertensive heart disease with heart failure: Secondary | ICD-10-CM | POA: Diagnosis present

## 2018-07-29 DIAGNOSIS — R159 Full incontinence of feces: Secondary | ICD-10-CM | POA: Diagnosis present

## 2018-07-29 DIAGNOSIS — A419 Sepsis, unspecified organism: Secondary | ICD-10-CM | POA: Diagnosis present

## 2018-07-29 DIAGNOSIS — R945 Abnormal results of liver function studies: Secondary | ICD-10-CM | POA: Diagnosis not present

## 2018-07-29 DIAGNOSIS — Z20828 Contact with and (suspected) exposure to other viral communicable diseases: Secondary | ICD-10-CM | POA: Diagnosis present

## 2018-07-29 DIAGNOSIS — J9602 Acute respiratory failure with hypercapnia: Secondary | ICD-10-CM | POA: Diagnosis present

## 2018-07-29 DIAGNOSIS — F419 Anxiety disorder, unspecified: Secondary | ICD-10-CM | POA: Diagnosis present

## 2018-07-29 DIAGNOSIS — N179 Acute kidney failure, unspecified: Secondary | ICD-10-CM | POA: Diagnosis not present

## 2018-07-29 DIAGNOSIS — Z515 Encounter for palliative care: Secondary | ICD-10-CM | POA: Diagnosis present

## 2018-07-29 DIAGNOSIS — I10 Essential (primary) hypertension: Secondary | ICD-10-CM

## 2018-07-29 DIAGNOSIS — R230 Cyanosis: Secondary | ICD-10-CM | POA: Diagnosis present

## 2018-07-29 DIAGNOSIS — J189 Pneumonia, unspecified organism: Secondary | ICD-10-CM | POA: Diagnosis present

## 2018-07-29 DIAGNOSIS — E860 Dehydration: Secondary | ICD-10-CM | POA: Diagnosis present

## 2018-07-29 DIAGNOSIS — F0390 Unspecified dementia without behavioral disturbance: Secondary | ICD-10-CM | POA: Diagnosis present

## 2018-07-29 DIAGNOSIS — M1990 Unspecified osteoarthritis, unspecified site: Secondary | ICD-10-CM | POA: Diagnosis present

## 2018-07-29 DIAGNOSIS — R7989 Other specified abnormal findings of blood chemistry: Secondary | ICD-10-CM | POA: Diagnosis present

## 2018-07-29 DIAGNOSIS — Z79899 Other long term (current) drug therapy: Secondary | ICD-10-CM | POA: Diagnosis not present

## 2018-07-29 DIAGNOSIS — I5032 Chronic diastolic (congestive) heart failure: Secondary | ICD-10-CM | POA: Diagnosis present

## 2018-07-29 DIAGNOSIS — Z66 Do not resuscitate: Secondary | ICD-10-CM | POA: Diagnosis present

## 2018-07-29 LAB — RESPIRATORY PANEL BY PCR

## 2018-07-29 LAB — TRIGLYCERIDES: Triglycerides: 64 mg/dL (ref <150)

## 2018-07-29 LAB — BLOOD GAS, ARTERIAL
Acid-base deficit: 6.8 mmol/L — ABNORMAL HIGH (ref 0.0–2.0)
Bicarbonate: 18.4 mmol/L — ABNORMAL LOW (ref 20.0–28.0)
Drawn by: 347621
O2 Content: 5 L/min
O2 Saturation: 90.3 %
Patient temperature: 98.5
pCO2 arterial: 37.6 mmHg (ref 32.0–48.0)
pH, Arterial: 7.31 — ABNORMAL LOW (ref 7.350–7.450)
pO2, Arterial: 62.8 mmHg — ABNORMAL LOW (ref 83.0–108.0)

## 2018-07-29 LAB — PROTIME-INR
INR: 1.1 (ref 0.8–1.2)
Prothrombin Time: 14.5 seconds (ref 11.4–15.2)

## 2018-07-29 LAB — FERRITIN: Ferritin: 112 ng/mL (ref 11–307)

## 2018-07-29 LAB — LACTATE DEHYDROGENASE: LDH: 302 U/L — ABNORMAL HIGH (ref 98–192)

## 2018-07-29 LAB — INFLUENZA PANEL BY PCR (TYPE A & B)
Influenza A By PCR: NEGATIVE
Influenza B By PCR: NEGATIVE

## 2018-07-29 LAB — FIBRINOGEN: Fibrinogen: 406 mg/dL (ref 210–475)

## 2018-07-29 LAB — LACTIC ACID, PLASMA
Lactic Acid, Venous: 4.7 mmol/L (ref 0.5–1.9)
Lactic Acid, Venous: 3.6 mmol/L (ref 0.5–1.9)
Lactic Acid, Venous: 5.6 mmol/L (ref 0.5–1.9)

## 2018-07-29 LAB — PROCALCITONIN: Procalcitonin: 8.46 ng/mL

## 2018-07-29 LAB — MRSA PCR SCREENING: MRSA by PCR: NEGATIVE

## 2018-07-29 LAB — D-DIMER, QUANTITATIVE: D-Dimer, Quant: 12.03 ug/mL-FEU — ABNORMAL HIGH (ref 0.00–0.50)

## 2018-07-29 LAB — C-REACTIVE PROTEIN: CRP: <0.8 mg/dL (ref <1.0)

## 2018-07-29 LAB — BRAIN NATRIURETIC PEPTIDE: B Natriuretic Peptide: 90.7 pg/mL (ref 0.0–100.0)

## 2018-07-29 LAB — SARS CORONAVIRUS 2 BY RT PCR (HOSPITAL ORDER, PERFORMED IN ~~LOC~~ HOSPITAL LAB): SARS Coronavirus 2: NEGATIVE

## 2018-07-29 LAB — POTASSIUM: Potassium: 3.2 mmol/L — ABNORMAL LOW (ref 3.5–5.1)

## 2018-07-29 MED ORDER — IPRATROPIUM BROMIDE 0.02 % IN SOLN
0.5000 mg | RESPIRATORY_TRACT | Status: DC
  Administered 2018-07-29 (×4): 0.5 mg via RESPIRATORY_TRACT
  Filled 2018-07-29 (×4): qty 2.5

## 2018-07-29 MED ORDER — GLYCOPYRROLATE 0.2 MG/ML IJ SOLN
0.2000 mg | Freq: Once | INTRAMUSCULAR | Status: AC
  Administered 2018-07-29: 0.2 mg via INTRAVENOUS
  Filled 2018-07-29: qty 1

## 2018-07-29 MED ORDER — ALBUTEROL SULFATE (2.5 MG/3ML) 0.083% IN NEBU: 2.5000 mg | INHALATION_SOLUTION | RESPIRATORY_TRACT | Status: DC | PRN

## 2018-07-29 MED ORDER — LORAZEPAM 2 MG/ML PO CONC: 1.0000 mg | ORAL | Status: DC | PRN

## 2018-07-29 MED ORDER — ACETAMINOPHEN 325 MG PO TABS: 650.0000 mg | ORAL_TABLET | Freq: Four times a day (QID) | ORAL | Status: DC | PRN

## 2018-07-29 MED ORDER — CALCIUM CARBONATE 1250 (500 CA) MG PO TABS
1.0000 | ORAL_TABLET | Freq: Every day | ORAL | Status: DC
  Filled 2018-07-29: qty 1

## 2018-07-29 MED ORDER — MORPHINE SULFATE (PF) 2 MG/ML IV SOLN
2.0000 mg | INTRAVENOUS | Status: DC | PRN
  Administered 2018-07-29: 2 mg via INTRAVENOUS
  Filled 2018-07-29: qty 1

## 2018-07-29 MED ORDER — MORPHINE SULFATE (PF) 2 MG/ML IV SOLN
2.0000 mg | INTRAVENOUS | Status: DC | PRN
  Administered 2018-07-29 – 2018-07-30 (×6): 2 mg via INTRAVENOUS
  Filled 2018-07-29 (×6): qty 1

## 2018-07-29 MED ORDER — ONDANSETRON HCL 4 MG/2ML IJ SOLN: 4.0000 mg | Freq: Four times a day (QID) | INTRAMUSCULAR | Status: DC | PRN

## 2018-07-29 MED ORDER — LEVALBUTEROL HCL 1.25 MG/0.5ML IN NEBU
1.2500 mg | INHALATION_SOLUTION | Freq: Four times a day (QID) | RESPIRATORY_TRACT | Status: DC
  Administered 2018-07-29 (×4): 1.25 mg via RESPIRATORY_TRACT
  Filled 2018-07-29 (×4): qty 0.5

## 2018-07-29 MED ORDER — BISACODYL 10 MG RE SUPP: 10.0000 mg | Freq: Every day | RECTAL | Status: DC | PRN

## 2018-07-29 MED ORDER — OLANZAPINE 5 MG PO TBDP
5.0000 mg | ORAL_TABLET | Freq: Every day | ORAL | Status: DC
  Administered 2018-07-29: 5 mg via ORAL
  Filled 2018-07-29: qty 1

## 2018-07-29 MED ORDER — HYDRALAZINE HCL 20 MG/ML IJ SOLN: 5.0000 mg | INTRAMUSCULAR | Status: DC | PRN

## 2018-07-29 MED ORDER — PIPERACILLIN-TAZOBACTAM 3.375 G IVPB
3.3750 g | Freq: Three times a day (TID) | INTRAVENOUS | Status: DC
  Filled 2018-07-29 (×3): qty 50

## 2018-07-29 MED ORDER — LORAZEPAM 2 MG/ML IJ SOLN
1.0000 mg | INTRAMUSCULAR | Status: DC | PRN
  Administered 2018-07-29: 1 mg via INTRAVENOUS
  Filled 2018-07-29: qty 1

## 2018-07-29 MED ORDER — ONDANSETRON 4 MG PO TBDP: 4.0000 mg | ORAL_TABLET | Freq: Four times a day (QID) | ORAL | Status: DC | PRN

## 2018-07-29 MED ORDER — ENOXAPARIN SODIUM 30 MG/0.3ML ~~LOC~~ SOLN
30.0000 mg | SUBCUTANEOUS | Status: DC
  Administered 2018-07-29: 30 mg via SUBCUTANEOUS
  Filled 2018-07-29: qty 0.3

## 2018-07-29 MED ORDER — ORAL CARE MOUTH RINSE: 15.0000 mL | Freq: Two times a day (BID) | OROMUCOSAL | Status: DC

## 2018-07-29 MED ORDER — LORAZEPAM 2 MG/ML IJ SOLN
0.5000 mg | Freq: Once | INTRAMUSCULAR | Status: AC
  Administered 2018-07-29: 0.5 mg via INTRAVENOUS
  Filled 2018-07-29: qty 1

## 2018-07-29 MED ORDER — GLYCOPYRROLATE 0.2 MG/ML IJ SOLN
0.2000 mg | Freq: Four times a day (QID) | INTRAMUSCULAR | Status: DC | PRN
  Administered 2018-07-29 – 2018-07-30 (×3): 0.2 mg via INTRAVENOUS
  Filled 2018-07-29 (×4): qty 1

## 2018-07-29 MED ORDER — SODIUM CHLORIDE 0.9 % IV SOLN
INTRAVENOUS | Status: DC
  Administered 2018-07-29: 03:00:00 via INTRAVENOUS

## 2018-07-29 MED ORDER — LORAZEPAM 1 MG PO TABS: 1.0000 mg | ORAL_TABLET | ORAL | Status: DC | PRN

## 2018-07-29 MED ORDER — ACETAMINOPHEN 650 MG RE SUPP
650.0000 mg | Freq: Four times a day (QID) | RECTAL | Status: DC | PRN
  Administered 2018-07-30: 650 mg via RECTAL
  Filled 2018-07-29: qty 1

## 2018-07-29 MED ORDER — ONDANSETRON HCL 4 MG/2ML IJ SOLN: 4.0000 mg | Freq: Three times a day (TID) | INTRAMUSCULAR | Status: DC | PRN

## 2018-07-29 MED ORDER — IPRATROPIUM BROMIDE 0.02 % IN SOLN: 0.5000 mg | Freq: Four times a day (QID) | RESPIRATORY_TRACT | Status: DC

## 2018-07-29 MED ORDER — VITAMIN D 25 MCG (1000 UNIT) PO TABS
1000.0000 [IU] | ORAL_TABLET | Freq: Every day | ORAL | Status: DC
  Filled 2018-07-29: qty 1

## 2018-07-29 MED ORDER — DM-GUAIFENESIN ER 30-600 MG PO TB12: 1.0000 | ORAL_TABLET | Freq: Two times a day (BID) | ORAL | Status: DC | PRN

## 2018-07-29 MED ORDER — CRANBERRY 425 MG PO CAPS: 1.0000 | ORAL_CAPSULE | Freq: Every day | ORAL | Status: DC

## 2018-07-29 MED ORDER — SODIUM CHLORIDE 0.9 % IV SOLN
1.0000 g | INTRAVENOUS | Status: DC
  Filled 2018-07-29: qty 10

## 2018-07-29 MED ORDER — MINERAL OIL LIGHT OIL: 2.0000 [drp] | TOPICAL_OIL | Status: DC

## 2018-07-29 MED ORDER — SODIUM CHLORIDE 0.9 % IV SOLN
500.0000 mg | INTRAVENOUS | Status: DC
  Administered 2018-07-29: 500 mg via INTRAVENOUS
  Filled 2018-07-29 (×2): qty 500

## 2018-07-29 NOTE — ED Notes (Signed)
Pt has made repeated attempts to remove nonrebreather. RN has told patient to keep mask on as O2 immediately drops. Pt says she wants something to drink

## 2018-07-29 NOTE — Progress Notes (Addendum)
Placed placed on 15L HFNC due to patient repeatedly asking to "remove the mask".  BIPAP not placed at this time due to this reason. Patient will not tolerate. Spo2 92% at this time. ABG collected. Nasotracheal suctioned for large amount of thin tan/white secretions.

## 2018-07-29 NOTE — Progress Notes (Signed)
Patient seen and evaluated, chart reviewed, please see EMR for updated orders. Please see full H&P dictated by admitting physician Dr. Clyde Lundborg for same date of service.     85 y.o. female with medical history significant of hypertension, dementia, dHF, DNR/DNI, anxiety admitted on 07/29/2018 with acute hypoxic respiratory failure requiring BiPAP   Patient is found to be septic secondary to pneumonia with persistent elevated lactic acid levels and persistent hypoxia---  Patient's daughter Ms Mickey Farber [phone])... Is an [employer] fundraiser.... Plan of care discussed with patient's daughter, she spoke with her brother who lives in [place]----and the family have decided to transition to full comfort measures without further active/aggressive treatment protocols  She also informed RN Nira Conn of the decision to transition to comfort care only  Shon Hale, MD

## 2018-07-29 NOTE — H&P (Signed)
History and Physical    Frances Jackson UUV:[id number] DOB: [birth year] DOA: 08-01-2018  Referring MD/NP/PA:   PCP: System, Pcp Not In   Patient coming from:  The patient is coming from SNF.  At baseline, pt is dependent for most of ADL.        Chief Complaint: SOB, Cyanosis, nausea, vomiting and incontinence of stool  HPI: Frances Jackson is a 85 y.o. female with medical history significant of hypertension, dementia, dHF, DNR/DNI, anxiety, who presents with shortness of breath, cyanosis, nausea, vomiting and incontinence of stool.   Per report, pt was noted to have vomiting with incontinent of stool at about 21:30 in SNF. She also has cyanotic lips and fingers.  Oxygen saturation was 60% on room air which improved to 90% after giving sublingual nitroglycerin and being placed on a nonrebreather mask. Pt was hypertensive with Bp 223/110, then dropped to 92/62 after giving nitroglycerin, then 108/61 currently.  Patient is dry cough, but denies chest pain.  No diarrhea or abdominal pain.  No symptoms of UTI.  She moves all extremities. Pt has fever 101.1 in ED  ED Course: pt was found to have WBC 11.3, lactic acid 5.3, negative troponin, urinalysis (hazy appearance, trace amount of leukocyte, few bacteria, WBC 6-30), potassium 5.7 with hemolysis, AKI with creatinine 1.17, BUN 19, mild abnormal liver function (ALP 55, AST 88, ALT 17, total bilirubin 2.7).  Temperature well 1.1, tachycardia, tachypnea, oxygen saturation 92 to 96% on nonrebreather. chest x-ray showed bilateral patchy opacity.  COVID-19 negataive and COVID10-related test pending.  Patient is admitted to stepdown as inpatient.  Review of Systems:   General: has fevers, chills, no body weight gain, has fatigue HEENT: no blurry vision, hearing changes or sore throat Respiratory: has dyspnea, coughing, no wheezing CV: no chest pain, no palpitations GI: has nausea, vomiting, incontinence of stool, no abdominal pain, diarrhea,  constipation GU: no dysuria, burning on urination, increased urinary frequency, hematuria  Ext: has mild leg edema Neuro: no unilateral weakness, numbness, or tingling, no vision change or hearing loss Skin: no rash, no skin tear. MSK: No muscle spasm, no deformity, no limitation of range of movement in spin Heme: No easy bruising.  Travel history: No recent long distant travel.  Allergy: No Known Allergies  Past Medical History:  Diagnosis Date  . Anxiety   . Arthritis   . Dementia (HCC)   . Edema 05/08/2015  . Essential hypertension 05/08/2015  . Hypertension     Past Surgical History:  Procedure Laterality Date  . arm surgery      Social History:  reports that she has never smoked. She has never used smokeless tobacco. She reports current alcohol use of about 28.0 standard drinks of alcohol per week. She reports that she does not use drugs.  Family History: pt is not very clear about family hx.  Prior to Admission medications   Medication Sig Start Date End Date Taking? Authorizing Provider  acetaminophen (TYLENOL) 325 MG tablet Take 650 mg by mouth daily.     [provider]  cholecalciferol (VITAMIN D3) 25 MCG (1000 UT) tablet Take 1,000 Units by mouth daily.    [provider]  Cranberry 425 MG CAPS Take 1 capsule by mouth daily.    [provider]  furosemide (LASIX) 20 MG tablet Take 20 mg by mouth daily. 0.5mg  tab once a day    [provider]  mineral oil external liquid Place 2 drops into the right ear once  a week.    [provider]  NON FORMULARY Take 2.5 oz by mouth 2 (two) times daily. 2.5 oz wine at 4pm and 9pm if requested by patient    [provider]  Oyster Shell 500 MG TABS Take 500 mg by mouth daily.    [provider]  potassium chloride (K-DUR) 10 MEQ tablet Take 10 mEq by mouth daily.    [provider]    Physical Exam: Vitals:   08/10/2018 2245 08/23/2018 2250 08/02/2018 2302  08/08/2018 2311  BP: 92/62  (!) 94/56 108/61  Pulse: (!) 134  (!) 131   Resp: (!) 33  (!) 32 (!) 23  Temp:  (!) 101.1 F (38.4 C)    TempSrc:  Rectal    SpO2: 92%  96%   Weight:      Height:       General: Not in acute distress HEENT:       Eyes: PERRL, EOMI, no scleral icterus.       ENT: No discharge from the ears and nose, no pharynx injection, no tonsillar enlargement.        Neck: No JVD, no bruit, no mass felt. Heme: No neck lymph node enlargement. Cardiac: S1/S2, RRR, No murmurs, No gallops or rubs. Respiratory: Diffuse coarse breathing sound and rhonchi bilaterally GI: Soft, nondistended, nontender, no rebound pain, no organomegaly, BS present. GU: No hematuria Ext: has trace leg edema bilaterally. 2+DP/PT pulse bilaterally. Musculoskeletal: No joint deformities, No joint redness or warmth, no limitation of ROM in spin. Skin: No rashes.  Neuro: Alert, cranial nerves II-XII grossly intact, moves all extremities normally. Psych: Patient is not psychotic, no suicidal or hemocidal ideation.  Labs on Admission: I have personally reviewed following labs and imaging studies  CBC: Recent Labs  Lab 08/05/2018 2250  WBC 11.3*  NEUTROABS 9.6*  HGB 15.0  HCT 46.9*  MCV 97.1  PLT 326   Basic Metabolic Panel: Recent Labs  Lab 08/02/2018 2250  NA 132*  K 5.7*  CL 99  CO2 16*  GLUCOSE 234*  BUN 19  CREATININE 1.17*  CALCIUM 8.5*   GFR: Estimated Creatinine Clearance: 26.6 mL/min (A) (by C-G formula based on SCr of 1.17 mg/dL (H)). Liver Function Tests: Recent Labs  Lab 08/20/2018 2250  AST 88*  ALT 17  ALKPHOS 55  BILITOT 2.7*  PROT 7.0  ALBUMIN 3.9   No results for input(s): LIPASE, AMYLASE in the last 168 hours. No results for input(s): AMMONIA in the last 168 hours. Coagulation Profile: Recent Labs  Lab 08/22/2018 2345  INR 1.1   Cardiac Enzymes: Recent Labs  Lab 08/10/2018 2250  TROPONINI <0.03   BNP (last 3 results) No results for input(s): PROBNP in  the last 8760 hours. HbA1C: No results for input(s): HGBA1C in the last 72 hours. CBG: No results for input(s): GLUCAP in the last 168 hours. Lipid Profile: Recent Labs    07/29/18 0030  TRIG 64   Thyroid Function Tests: No results for input(s): TSH, T4TOTAL, FREET4, T3FREE, THYROIDAB in the last 72 hours. Anemia Panel: Recent Labs    08/20/2018 2345  FERRITIN 112   Urine analysis:    Component Value Date/Time   COLORURINE YELLOW 03/07/2016 0555   APPEARANCEUR HAZY (A) 03/07/2016 0555   LABSPEC 1.005 03/07/2016 0555   PHURINE 7.0 03/07/2016 0555   GLUCOSEU NEGATIVE 03/07/2016 0555   HGBUR SMALL (A) 03/07/2016 0555   BILIRUBINUR NEGATIVE 03/07/2016 0555   KETONESUR NEGATIVE 03/07/2016 0555  PROTEINUR NEGATIVE 03/07/2016 0555   UROBILINOGEN 0.2 02/08/2015 1702   NITRITE NEGATIVE 03/07/2016 0555   LEUKOCYTESUR TRACE (A) 03/07/2016 0555   Sepsis Labs: (procalcitonin:4,lacticidven:4) ) Recent Results (from the past 240 hour(s))  SARS Coronavirus 2 (CEPHEID- Performed in [facility] hospital lab), Hosp Order     Status: None   Collection Time: 08/06/2018 11:46 PM  Result Value Ref Range Status   SARS Coronavirus 2 NEGATIVE NEGATIVE Final    Comment: (NOTE) If result is NEGATIVE SARS-CoV-2 target nucleic acids are NOT DETECTED. The SARS-CoV-2 RNA is generally detectable in upper and lower  respiratory specimens during the acute phase of infection. The lowest  concentration of SARS-CoV-2 viral copies this assay can detect is 250  copies / mL. A negative result does not preclude SARS-CoV-2 infection  and should not be used as the sole basis for treatment or other  patient management decisions.  A negative result may occur with  improper specimen collection / handling, submission of specimen other  than nasopharyngeal swab, presence of viral mutation(s) within the  areas targeted by this assay, and inadequate number of viral copies  (<250 copies / mL). A negative  result must be combined with clinical  observations, patient history, and epidemiological information. If result is POSITIVE SARS-CoV-2 target nucleic acids are DETECTED. The SARS-CoV-2 RNA is generally detectable in upper and lower  respiratory specimens dur ing the acute phase of infection.  Positive  results are indicative of active infection with SARS-CoV-2.  Clinical  correlation with patient history and other diagnostic information is  necessary to determine patient infection status.  Positive results do  not rule out bacterial infection or co-infection with other viruses. If result is PRESUMPTIVE POSTIVE SARS-CoV-2 nucleic acids MAY BE PRESENT.   A presumptive positive result was obtained on the submitted specimen  and confirmed on repeat testing.  While 2019 novel coronavirus  (SARS-CoV-2) nucleic acids may be present in the submitted sample  additional confirmatory testing may be necessary for epidemiological  and / or clinical management purposes  to differentiate between  SARS-CoV-2 and other Sarbecovirus currently known to infect humans.  If clinically indicated additional testing with an alternate test  methodology [phone]) is advised. The SARS-CoV-2 RNA is generally  detectable in upper and lower respiratory sp ecimens during the acute  phase of infection. The expected result is Negative. Fact Sheet for Patients:  BoilerBrush.com.cy Fact Sheet for Healthcare Providers: https://pope.com/ This test is not yet approved or cleared by the [place] FDA and has been authorized for detection and/or diagnosis of SARS-CoV-2 by FDA under an Emergency Use Authorization (EUA).  This EUA will remain in effect (meaning this test can be used) for the duration of the COVID-19 declaration under Section 564(b)(1) of the Act, 21 U.S.C. section 360bbb-3(b)(1), unless the authorization is terminated or revoked sooner. Performed at [facility], [address], [place], [place] [address]      Radiological Exams on Admission: Dg Chest Port 1 View  Result Date: 07/29/2018 CLINICAL DATA:  Fever EXAM: PORTABLE CHEST 1 VIEW COMPARISON:  02/06/2015 FINDINGS: Bilateral airspace disease, most confluent in the right upper lobe and diffuse on the left concerning for pneumonia. No effusions. Heart is mildly enlarged. No acute bony abnormality. IMPRESSION: Patchy bilateral airspace opacities, most confluent in the right upper lobe, rather diffuse on the left. Findings concerning for pneumonia. COVID-19 cannot be excluded. Electronically Signed   By: Charlett Nose M.D.   On: 08/08/2018 23:42  EKG: Independently reviewed.  Sinus tachycardia, QTC 438, LAD, LAE, nonspecific T wave change.   Assessment/Plan Principal Problem:   HCAP (healthcare-associated pneumonia) Active Problems:   Essential hypertension   Palliative care encounter   Sepsis (HCC)   Abnormal LFTs   AKI (acute kidney injury) (HCC)   Aspiration pneumonia (HCC)   Acute respiratory failure with hypoxia and hypercapnia (HCC)   Chronic diastolic CHF (congestive heart failure) (HCC)   Sepsis and acute respiratory failure with hypoxia due to HCAP vs. Aspiration pneumonia: COVID19 negative.  Patient meets criteria for sepsis with leukocytosis, fever, tachycardia and tachypnea.  Pt has elevated D-dimer 12.03, mostly likely due to sepsis and ongoing infection, will not pursue further test now.  - will admit to SDU as inpt - IV Vancomycin, Zosyn, azithromycin (patient received 1 dose of cefepime and Flagyl in ED) - Mucinex for cough  - atrovent Xopenex Neb prn for SOB - Urine legionella and S. pneumococcal antigen - Follow up blood culture x2, sputum culture and respiratory virus panel, plus Flu pcr - will get Procalcitonin and trend lactic acid level per sepsis protocol - IVF: 2L of NS bolus in ED, followed by 100 mL per hour of NS  - check ABG -start BiPAP   Chronic diastolic CHF: 2D echo on 05/13/2015 showed EF 60-65% with grade 1 diastolic dysfunction.  Patient has a trace leg edema.  No JVD.  Chest x-ray did not show pulmonary edema. -Check BNP -Hold Lasix due to sepsis  Essential hypertension: Blood pressure 223/110 initially-->108/61.  -hold lasix due to sepsis -IV hydralazine as needed  Abnormal LFTs: mild. Likely due to sepsis.  -f/u by CMP  AKI: cre 1.17 and BUN 19. Likely due to dehydration and continuation of diuretics. UA showed trace amount of leukocyte, WBC 6-30 hazy appearance, few bacteria. No symptoms of UTI.  Patient is already on broad antibiotics as above. - IVF as above - Follow up renal function by BMP - f/u urine coulture - hold lasix  Hyperkalemia: Blood sample has hemolysis.  Potassium 5.7 -Repeat potassium level    Inpatient status:  # Patient requires inpatient status due to high intensity of service, high risk for further deterioration and high frequency of surveillance required.  I certify that at the point of admission it is my clinical judgment that the patient will require inpatient hospital care spanning beyond 2 midnights from the point of admission.  . This patient has multiple chronic comorbidities including hypertension, dementia, dHF, DNR/DNI, anxiety . Now patient has presenting with sepsis and acute respiratory failure with hypoxia due to HCAP versus aspiration pneumonia. . The worrisome physical exam findings include diffused coarse breathing sound and rhonchi bilaterally on lung auscultation. . The initial radiographic and laboratory data are worrisome because of leukocytosis, elevated lactic acid, AKI.  Chest x-ray showed bilateral patchy infiltration. . Current medical needs: please see my assessment and plan . Predictability of an adverse outcome (risk): Patient has multiple comorbidities, now presents with sepsis and acute respiratory failure with hypoxia due to H CAP versus aspiration  pneumonia.  Given her old age, patient is at high risk of deteriorating.  Will need to be treated in hospital for at least 2 days.      DVT ppx: SQ Lovenox Code Status: DNR (has yellow paper document with DNR) Family Communication: None at bed side.    Disposition Plan:  Anticipate discharge back to SNF Consults called:  none Admission status: SDU/inpation       Date  of Service 07/29/2018    Lorretta HarpXilin Adia Crammer [facility]   If 7PM-7AM, please contact night-coverage [email or web address] Password [email or web address] 07/29/2018, 2:05 AM

## 2018-07-29 NOTE — NC FL2 (Signed)
[place] MEDICAID FL2 LEVEL OF CARE SCREENING TOOL     IDENTIFICATION  Patient Name: Frances Jackson Birthdate: Jan 04, 1922 Sex: female Admission Date (Current Location): August 27, 2018  [facility] and [place] Number:  Producer, television/film/video and Address:  [facility] [place], [address] [place], [place] [address]      Provider Number: [id number]  Attending Physician Name and Address:  Shon Hale, MD  Relative Name and Phone Number:  Mickey Farber [phone]    Current Level of Care: Hospital Recommended Level of Care: Assisted Living Facility Prior Approval Number:    Date Approved/Denied:   PASRR Number:    Discharge Plan: Other (Comment)(ALF)    Current Diagnoses: Patient Active Problem List   Diagnosis Date Noted  . Sepsis (HCC) 07/29/2018  . HCAP (healthcare-associated pneumonia) 07/29/2018  . Abnormal LFTs 07/29/2018  . AKI (acute kidney injury) (HCC) 07/29/2018  . Aspiration pneumonia (HCC) 07/29/2018  . Acute respiratory failure with hypoxia and hypercapnia (HCC) 07/29/2018  . Chronic diastolic CHF (congestive heart failure) (HCC) 07/29/2018  . Palliative care encounter 08/17/2017  . Edema 05/08/2015  . Essential hypertension 05/08/2015  . Lactic acidosis 02/06/2015  . UTI (lower urinary tract infection) 02/06/2015    Orientation RESPIRATION BLADDER Height & Weight     Self, Place, Time  Other (Comment)(BiPap 15L) Continent Weight: 141 lb 8.6 oz (64.2 kg) Height:  5\' 5"  (165.1 cm)  BEHAVIORAL SYMPTOMS/MOOD NEUROLOGICAL BOWEL NUTRITION STATUS      Continent Diet(heart healthy, thin liquids)  AMBULATORY STATUS COMMUNICATION OF NEEDS Skin   Limited Assist Verbally Normal                       Personal Care Assistance Level of Assistance  Bathing, Feeding, Dressing Bathing Assistance: Limited assistance Feeding assistance: Independent Dressing Assistance: Limited assistance     Functional Limitations Info  Sight, Hearing,  Speech Sight Info: Adequate Hearing Info: Adequate Speech Info: Adequate    SPECIAL CARE FACTORS FREQUENCY  PT (By licensed PT), OT (By licensed OT)                    Contractures Contractures Info: Not present    Additional Factors Info  Code Status, Allergies Code Status Info: DNR Allergies Info: NO known allergies           Current Medications (07/29/2018):  This is the current hospital active medication list Current Facility-Administered Medications  Medication Dose Route Frequency Provider Last Rate Last Dose  . 0.9 %  sodium chloride infusion   Intravenous Continuous Lorretta Harp, MD 100 mL/hr at 07/29/18 0315    . acetaminophen (TYLENOL) tablet 650 mg  650 mg Oral Q6H PRN Lorretta Harp, MD      . azithromycin (ZITHROMAX) 500 mg in sodium chloride 0.9 % 250 mL IVPB  500 mg Intravenous Q24H Lorretta Harp, MD   Stopped at 07/29/18 0500  . calcium carbonate (OS-CAL - dosed in mg of elemental calcium) tablet 500 mg of elemental calcium  1 tablet Oral Q breakfast Lorretta Harp, MD      . cefTRIAXone (ROCEPHIN) 1 g in sodium chloride 0.9 % 100 mL IVPB  1 g Intravenous Q24H Emokpae, Courage, MD      . cholecalciferol (VITAMIN D3) tablet 1,000 Units  1,000 Units Oral Daily Lorretta Harp, MD      . dextromethorphan-guaiFENesin [facility] DM) 30-600 MG per 12 hr tablet 1 tablet  1 tablet Oral BID PRN Lorretta Harp,  MD      . enoxaparin (LOVENOX) injection 30 mg  30 mg Subcutaneous Q24H Lorretta HarpNiu, Xilin, MD   30 mg at 07/29/18 1350  . hydrALAZINE (APRESOLINE) injection 5 mg  5 mg Intravenous Q2H PRN Lorretta HarpNiu, Xilin, MD      . ipratropium (ATROVENT) nebulizer solution 0.5 mg  0.5 mg Nebulization Q6H Emokpae, Courage, MD      . levalbuterol (XOPENEX) nebulizer solution 1.25 mg  1.25 mg Nebulization Q6H Lorretta HarpNiu, Xilin, MD   1.25 mg at 07/29/18 1112  . MEDLINE mouth rinse  15 mL Mouth Rinse BID Lorretta HarpNiu, Xilin, MD      . morphine 2 MG/ML injection 2 mg  2 mg Intravenous Q4H PRN Emokpae, Courage, MD      . ondansetron  (ZOFRAN) injection 4 mg  4 mg Intravenous Q8H PRN Lorretta HarpNiu, Xilin, MD         Discharge Medications: Please see discharge summary for a list of discharge medications.  Relevant Imaging Results:  Relevant Lab Results:   Additional Information SSN: [id number]  Maree KrabbeBridget A Tyiesha Brackney, LCSW

## 2018-07-29 NOTE — Clinical Social Work Note (Signed)
Pt is from [facility].   [place], [place] [phone]

## 2018-07-29 NOTE — Progress Notes (Signed)
Patient restless, continued attempts to get out of bed, lung sounds wet and gurgly, NT and oral suctioned with minimal amount pink frothy sputum obtained, pt appears in distress, RT at bedside to attempt suctioning more, pt placed back on 100% NRB due to oxygen sat 70-80s on 15L HFNC, sats on NRB 83-88%, patient constantly trying to remove NRB mask, NP Bodenheimer paged, spoke with NP regarding patient current status and care, RT placed patient on BiPap with increase in sats to 94-97%, patient given Robinol IV to help with secretions and Ativan IV to relax patient. Patient currently appears more comfortable, remains tachycardic 120s, tachypneic in 20s, on BiPap with 100% FiO2, sats 96%.

## 2018-07-29 NOTE — ED Notes (Signed)
Call 2W to give report. RN said she will call back cause currently busy

## 2018-07-29 NOTE — Progress Notes (Signed)
Spoke with daughter, Lupita Leash, in the patient's room regarding her thoughts on prognosis and desires moving forward. Patient's daughter states she has worked in palliative care and is very open to the idea of palliative care given the circumstances. When asked my opinion, I gave objective information such as O2 saturation on room air (52% with good waveform) and overall decreased irritability off Bipap. Discussed the idea with Dr. Mariea Clonts as well, who is transitioning orders to comfort care. Will continue to monitor for improved comfort measures.  Lorenza Cambridge, RN 07/29/18 6:38 PM

## 2018-07-29 NOTE — ED Notes (Addendum)
ED TO INPATIENT HANDOFF REPORT  ED Nurse Name and Phone #:  Erianna Jolly [phone]  S Name/Age/Gender Frances Jackson 85 y.o. female Room/Bed: 031C/031C  Code Status   Code Status: DNR  Home/SNF/Other Home Patient oriented to: self, place, time and situation Is this baseline? Yes   Triage Complete: Triage complete  Chief Complaint SOB  Triage Note Brought by ems from [facility].  Per staff was good at 8pm.  At 2130 found lying in bed with vomit on mouth and incontinent of stool.  Also reports lips and fingers were cyanotic.  Initial sat reported at 60% on room air.  Up to 90 after giving ntg 4 sl en route and placed on non rebreather.  Initial BP per ems was 223/110.  Down to 130/64 after ntg.     Allergies No Known Allergies  Level of Care/Admitting Diagnosis ED Disposition    ED Disposition Condition Comment   Admit  Hospital Area: [facility] [[id number]]  Level of Care: Progressive [102]  Covid Evaluation: N/A  Diagnosis: HCAP (healthcare-associated pneumonia) [144818]  Admitting Physician: Lorretta Harp [[id number]]  Attending Physician: Lorretta Harp [phone]  Estimated length of stay: past midnight tomorrow  Certification:: I certify this patient will need inpatient services for at least 2 midnights  Bed request comments: COVID19 negative  PT Class (Do Not Modify): Inpatient [101]  PT Acc Code (Do Not Modify): Private [1]       B Medical/Surgery History Past Medical History:  Diagnosis Date  . Anxiety   . Arthritis   . Dementia (HCC)   . Edema 05/08/2015  . Essential hypertension 05/08/2015  . Hypertension    Past Surgical History:  Procedure Laterality Date  . arm surgery       A IV Location/Drains/Wounds Patient Lines/Drains/Airways Status   Active Line/Drains/Airways    Name:   Placement date:   Placement time:   Site:   Days:   Peripheral IV 08-25-2018 Right Forearm   08/25/2018    2321    Forearm   1   Peripheral IV 08-25-18 Left  Antecubital   2018/08/25    2200    Antecubital   1   Wound 05/04/13 Laceration Hand Right   05/04/13    1640    Hand   1912   Wound 05/04/13 Avulsion Finger (Comment which one) Left small avulsion to left thumb   05/04/13    1643    Finger (Comment which one)   1912          Intake/Output Last 24 hours No intake or output data in the 24 hours ending 07/29/18 0147  Labs/Imaging Results for orders placed or performed during the hospital encounter of 25-Aug-2018 (from the past 48 hour(s))  Comprehensive metabolic panel     Status: Abnormal   Collection Time: 08-25-18 10:50 PM  Result Value Ref Range   Sodium 132 (L) 135 - 145 mmol/L   Potassium 5.7 (H) 3.5 - 5.1 mmol/L    Comment: SPECIMEN HEMOLYZED. HEMOLYSIS MAY AFFECT INTEGRITY OF RESULTS.   Chloride 99 98 - 111 mmol/L   CO2 16 (L) 22 - 32 mmol/L   Glucose, Bld 234 (H) 70 - 99 mg/dL   BUN 19 8 - 23 mg/dL   Creatinine, Ser 4.97 (H) 0.44 - 1.00 mg/dL   Calcium 8.5 (L) 8.9 - 10.3 mg/dL   Total Protein 7.0 6.5 - 8.1 g/dL   Albumin 3.9 3.5 - 5.0 g/dL   AST 88 (  H) 15 - 41 U/L   ALT 17 0 - 44 U/L   Alkaline Phosphatase 55 38 - 126 U/L   Total Bilirubin 2.7 (H) 0.3 - 1.2 mg/dL   GFR calc non Af Amer 40 (L) >60 mL/min   GFR calc Af Amer 46 (L) >60 mL/min   Anion gap 17 (H) 5 - 15    Comment: Performed at [facility], [address] [place], [place] [address]  Lactic acid, plasma     Status: Abnormal   Collection Time: 04/05/2018 10:50 PM  Result Value Ref Range   Lactic Acid, Venous 5.3 (HH) 0.5 - 1.9 mmol/L    Comment: CRITICAL RESULT CALLED TO, READ BACK BY AND VERIFIED WITH: LOWDERMIKLK S,RN 04/05/2018 2324 WAYK Performed at [facility], [address] [place], [place] [address]   CBC with Differential     Status: Abnormal   Collection Time: 04/05/2018 10:50 PM  Result Value Ref Range   WBC 11.3 (H) 4.0 - 10.5 K/uL   RBC 4.83 3.87 - 5.11 MIL/uL   Hemoglobin 15.0 12.0 - 15.0 g/dL   HCT 82.946.9 (H) 56.236.0 - 13.046.0 %   MCV  97.1 80.0 - 100.0 fL   MCH 31.1 26.0 - 34.0 pg   MCHC 32.0 30.0 - 36.0 g/dL   RDW 86.513.9 78.411.5 - 69.615.5 %   Platelets 326 150 - 400 K/uL   nRBC 0.0 0.0 - 0.2 %   Neutrophils Relative % 85 %   Neutro Abs 9.6 (H) 1.7 - 7.7 K/uL   Lymphocytes Relative 12 %   Lymphs Abs 1.4 0.7 - 4.0 K/uL   Monocytes Relative 3 %   Monocytes Absolute 0.3 0.1 - 1.0 K/uL   Eosinophils Relative 0 %   Eosinophils Absolute 0.0 0.0 - 0.5 K/uL   Basophils Relative 0 %   Basophils Absolute 0.0 0.0 - 0.1 K/uL   Immature Granulocytes 0 %   Abs Immature Granulocytes 0.03 0.00 - 0.07 K/uL    Comment: Performed at [facility], [address] [place], [place] [address]  Troponin I - ONCE - STAT     Status: None   Collection Time: 04/05/2018 10:50 PM  Result Value Ref Range   Troponin I <0.03 <0.03 ng/mL    Comment: Performed at [facility], [address] [place], [place] [address]  Protime-INR     Status: None   Collection Time: 04/05/2018 11:45 PM  Result Value Ref Range   Prothrombin Time 14.5 11.4 - 15.2 seconds   INR 1.1 0.8 - 1.2    Comment: (NOTE) INR goal varies based on device and disease states. Performed at [facility], [address] [place], [place] [address]   D-dimer, quantitative     Status: Abnormal   Collection Time: 04/05/2018 11:45 PM  Result Value Ref Range   D-Dimer, Quant 12.03 (H) 0.00 - 0.50 ug/mL-FEU    Comment: (NOTE) At the manufacturer cut-off of 0.50 ug/mL FEU, this assay has been documented to exclude PE with a sensitivity and negative predictive value of 97 to 99%.  At this time, this assay has not been approved by the FDA to exclude DVT/VTE. Results should be correlated with clinical presentation. Performed at [facility], [address] [place], [place] [address]   Ferritin     Status: None   Collection Time: 04/05/2018 11:45 PM  Result Value Ref Range   Ferritin 112 11 - 307  ng/mL    Comment: Performed at [facility], [address],  [place], [place] [address]  Fibrinogen     Status: None   Collection Time: 08/08/2018 11:45 PM  Result Value Ref Range   Fibrinogen 406 210 - 475 mg/dL    Comment: Performed at [facility], [address] [place], [place] [address]  C-reactive protein     Status: None   Collection Time: 30-Jul-2018 11:45 PM  Result Value Ref Range   CRP <0.8 <1.0 mg/dL    Comment: Performed at [facility], [address] [place], [place] [address]  SARS Coronavirus 2 (CEPHEID- Performed in [facility] hospital lab), Hosp Order     Status: None   Collection Time: 08/20/2018 11:46 PM  Result Value Ref Range   SARS Coronavirus 2 NEGATIVE NEGATIVE    Comment: (NOTE) If result is NEGATIVE SARS-CoV-2 target nucleic acids are NOT DETECTED. The SARS-CoV-2 RNA is generally detectable in upper and lower  respiratory specimens during the acute phase of infection. The lowest  concentration of SARS-CoV-2 viral copies this assay can detect is 250  copies / mL. A negative result does not preclude SARS-CoV-2 infection  and should not be used as the sole basis for treatment or other  patient management decisions.  A negative result may occur with  improper specimen collection / handling, submission of specimen other  than nasopharyngeal swab, presence of viral mutation(s) within the  areas targeted by this assay, and inadequate number of viral copies  (<250 copies / mL). A negative result must be combined with clinical  observations, patient history, and epidemiological information. If result is POSITIVE SARS-CoV-2 target nucleic acids are DETECTED. The SARS-CoV-2 RNA is generally detectable in upper and lower  respiratory specimens dur ing the acute phase of infection.  Positive  results are indicative of active infection with SARS-CoV-2.  Clinical  correlation with patient history and other diagnostic information is  necessary to determine patient infection status.  Positive results do  not rule out  bacterial infection or co-infection with other viruses. If result is PRESUMPTIVE POSTIVE SARS-CoV-2 nucleic acids MAY BE PRESENT.   A presumptive positive result was obtained on the submitted specimen  and confirmed on repeat testing.  While 2019 novel coronavirus  (SARS-CoV-2) nucleic acids may be present in the submitted sample  additional confirmatory testing may be necessary for epidemiological  and / or clinical management purposes  to differentiate between  SARS-CoV-2 and other Sarbecovirus currently known to infect humans.  If clinically indicated additional testing with an alternate test  methodology [phone]) is advised. The SARS-CoV-2 RNA is generally  detectable in upper and lower respiratory sp ecimens during the acute  phase of infection. The expected result is Negative. Fact Sheet for Patients:  BoilerBrush.com.cy Fact Sheet for Healthcare Providers: https://pope.com/ This test is not yet approved or cleared by the [place] FDA and has been authorized for detection and/or diagnosis of SARS-CoV-2 by FDA under an Emergency Use Authorization (EUA).  This EUA will remain in effect (meaning this test can be used) for the duration of the COVID-19 declaration under Section 564(b)(1) of the Act, 21 U.S.C. section 360bbb-3(b)(1), unless the authorization is terminated or revoked sooner. Performed at [facility], [address], [place], [place] [address]   Lactic acid, plasma     Status: Abnormal   Collection Time: 07/29/18 12:44 AM  Result Value Ref Range   Lactic Acid, Venous 4.7 (HH)  0.5 - 1.9 mmol/L    Comment: CRITICAL RESULT CALLED TO, READ BACK BY AND VERIFIED WITH: OSORIO B,RN 07/29/18 0143 WAYK Performed at [facility], [address], [place], [place] [address]    Dg Chest Port 1 View  Result Date: 08/05/2018 CLINICAL DATA:  Fever EXAM: PORTABLE CHEST 1 VIEW COMPARISON:  02/06/2015 FINDINGS:  Bilateral airspace disease, most confluent in the right upper lobe and diffuse on the left concerning for pneumonia. No effusions. Heart is mildly enlarged. No acute bony abnormality. IMPRESSION: Patchy bilateral airspace opacities, most confluent in the right upper lobe, rather diffuse on the left. Findings concerning for pneumonia. COVID-19 cannot be excluded. Electronically Signed   By: Charlett Nose M.D.   On: 08-05-2018 23:42    Pending Labs Unresulted Labs (From admission, onward)    Start     Ordered   07/29/18 0140  MRSA PCR Screening  Once,   R     07/29/18 0140   07/29/18 0138  Legionella Pneumophila Serogp 1 Ur Ag  Once,   R     07/29/18 0137   07/29/18 0136  Culture, sputum-assessment  Once,   R     07/29/18 0137   07/29/18 0136  Gram stain  Once,   R     07/29/18 0137   07/29/18 0136  Strep pneumoniae urinary antigen  Once,   R     07/29/18 0137   07/29/18 0135  Urine Culture  Add-on,   R    Comments:  Please get clean catch specimen ONLY (DO NOT obtain from urinal)    07/29/18 0134   07/29/18 0135  Lactic acid, plasma  STAT Now then every 3 hours,   STAT     07/29/18 0135   07/29/18 0134  Respiratory Panel by PCR  (Respiratory virus panel with precautions)  Once,   R     07/29/18 0133   07/29/18 0134  Blood gas, arterial  ONCE - STAT,   STAT     07/29/18 0133   07/29/18 0133  Influenza panel by PCR (type A & B)  Once,   R     07/29/18 0133   07/29/18 0132  Potassium  ONCE - STAT,   R     07/29/18 0131   07/29/18 0039  Brain natriuretic peptide  Once,   STAT     07/29/18 0038   07/29/18 0030  Lactate dehydrogenase  Once,   R     07/29/18 0030   07/29/18 0030  Procalcitonin  Once,   R     07/29/18 0030   07/29/18 0030  Triglycerides  Once,   R     07/29/18 0030   Aug 05, 2018 2244  Culture, blood (Routine x 2)  BLOOD CULTURE X 2,   STAT     2018-08-05 2243   08/05/18 2244  Urinalysis, Routine w reflex microscopic  ONCE - STAT,   STAT     2018/08/05 2243           Vitals/Pain Today's Vitals   05-Aug-2018 2245 05-Aug-2018 2250 2018/08/05 2302 08/05/2018 2311  BP: 92/62  (!) 94/56 108/61  Pulse: (!) 134  (!) 131   Resp: (!) 33  (!) 32 (!) 23  Temp:  (!) 101.1 F (38.4 C)    TempSrc:  Rectal    SpO2: 92%  96%   Weight:      Height:      PainSc:  Isolation Precautions Droplet precaution  Medications Medications  metroNIDAZOLE (FLAGYL) IVPB 500 mg (has no administration in time range)  vancomycin (VANCOCIN) 1,500 mg in sodium chloride 0.9 % 500 mL IVPB (1,500 mg Intravenous New Bag/Given 07/29/18 0049)  sodium chloride 0.9 % bolus 1,000 mL (has no administration in time range)    And  sodium chloride 0.9 % bolus 1,000 mL (1,000 mLs Intravenous New Bag/Given 07/29/18 0007)  vancomycin (VANCOCIN) IVPB 750 mg/150 ml premix (has no administration in time range)  azithromycin (ZITHROMAX) 500 mg in sodium chloride 0.9 % 250 mL IVPB (has no administration in time range)  0.9 %  sodium chloride infusion (has no administration in time range)  ipratropium (ATROVENT) nebulizer solution 0.5 mg (has no administration in time range)  levalbuterol (XOPENEX) nebulizer solution 1.25 mg (has no administration in time range)  dextromethorphan-guaiFENesin (MUCINEX DM) 30-600 MG per 12 hr tablet 1 tablet (has no administration in time range)  mineral oil external liquid 2 drop (has no administration in time range)  Cranberry CAPS 425 mg (has no administration in time range)  calcium carbonate (OS-CAL - dosed in mg of elemental calcium) tablet 500 mg of elemental calcium (has no administration in time range)  cholecalciferol (VITAMIN D3) tablet 1,000 Units (has no administration in time range)  enoxaparin (LOVENOX) injection 40 mg (has no administration in time range)  ondansetron (ZOFRAN) injection 4 mg (has no administration in time range)  acetaminophen (TYLENOL) tablet 650 mg (has no administration in time range)  piperacillin-tazobactam (ZOSYN) IVPB 3.375 g (has  no administration in time range)  sodium chloride flush (NS) 0.9 % injection 3 mL (3 mLs Intravenous Given 07/29/18 0048)  ceFEPIme (MAXIPIME) 2 g in sodium chloride 0.9 % 100 mL IVPB (0 g Intravenous Stopped 07/29/18 0049)  acetaminophen (TYLENOL) suppository 650 mg (650 mg Rectal Given 07/29/18 0007)    Mobility non-ambulatory High fall risk   Focused Assessments Cardiac Assessment Handoff:  Cardiac Rhythm: Sinus tachycardia Lab Results  Component Value Date   CKTOTAL 274 (H) 02/09/2015   TROPONINI <0.03 08/18/2018   Lab Results  Component Value Date   DDIMER 12.03 (H) 08/06/2018   Does the Patient currently have chest pain? No  , Pulmonary Assessment Handoff:  Lung sounds: Bilateral Breath Sounds: Rhonchi L Breath Sounds: Rhonchi R Breath Sounds: Rhonchi O2 Device: NRB        R Recommendations: See Admitting Provider Note  Report given to:   Additional Notes:  Pt has made repeated attempts to remove non-rebreather. Otherwise pleasant and alert to person, although speech and communcation has been difficult to rhonchus airway plus hard of hearing.

## 2018-07-29 NOTE — Progress Notes (Signed)
Civil engineer, contracting [facility]) Community Palliative  Received notification that Frances Jackson had been hospitalized.  ACC will continue to follow and update community palliative team as she nears discharge.  Thank you, Wallis Bamberg RN, BSN, CCRN [facility] Liaison  [phone]

## 2018-07-30 LAB — BASIC METABOLIC PANEL
Anion gap: 10 (ref 5–15)
BUN: 16 mg/dL (ref 8–23)
CO2: 20 mmol/L — ABNORMAL LOW (ref 22–32)
Calcium: 8.4 mg/dL — ABNORMAL LOW (ref 8.9–10.3)
Chloride: 112 mmol/L — ABNORMAL HIGH (ref 98–111)
Creatinine, Ser: 0.91 mg/dL (ref 0.44–1.00)
GFR calc Af Amer: >60 mL/min (ref >60)
GFR calc non Af Amer: 54 mL/min — ABNORMAL LOW (ref >60)
Glucose, Bld: 116 mg/dL — ABNORMAL HIGH (ref 70–99)
Potassium: 3.7 mmol/L (ref 3.5–5.1)
Sodium: 142 mmol/L (ref 135–145)

## 2018-07-30 MED ORDER — MORPHINE SULFATE (PF) 2 MG/ML IV SOLN: 2.0000 mg | INTRAVENOUS | Status: DC | PRN

## 2018-07-30 MED ORDER — GLYCOPYRROLATE 0.2 MG/ML IJ SOLN: 0.4000 mg | Freq: Four times a day (QID) | INTRAMUSCULAR | Status: DC

## 2018-07-30 MED ORDER — MORPHINE 100MG IN NS 100ML (1MG/ML) PREMIX INFUSION
2.0000 mg/h | INTRAVENOUS | Status: DC
  Administered 2018-07-30: 2 mg/h via INTRAVENOUS
  Filled 2018-07-30: qty 100

## 2018-08-02 LAB — CULTURE, BLOOD (ROUTINE X 2)
Culture: NO GROWTH
Culture: NO GROWTH

## 2018-08-26 NOTE — Progress Notes (Signed)
MD paged: Pt with frequent periods of secretions and air hunger.

## 2018-08-26 NOTE — Progress Notes (Signed)
Palliative:  I went to visit with Frances Jackson today and she appears comfortable on morphine infusion. I increased her Robinul to hopefully better control secretions. Discussed plans with bedside RN for anticipated hospital death. Likely not stable to transfer at this stage. RN reports daughter planning to come visit. No further needs per RN or Dr. Mariea Clonts.   Please call palliative at [phone] for any further palliative needs.   No charge  Yong Channel, NP Palliative Medicine Team Pager # [phone] (M-F 8a-5p) Team Phone # [phone] (Nights/Weekends)

## 2018-08-26 NOTE — Discharge Summary (Signed)
Frances Jackson [id number] DOB: November 02, 1921 DOA: 08/07/18  PCP: System, Pcp Not In PCP/Office notified  Admit date: 2018/08/07 Date of Death: 08-09-18  Final Diagnoses:  Principal Problem:   HCAP (healthcare-associated pneumonia) Active Problems:   Essential hypertension   Palliative care encounter   Sepsis (HCC)   Abnormal LFTs   AKI (acute kidney injury) (HCC)   Aspiration pneumonia (HCC)   Acute respiratory failure with hypoxia and hypercapnia (HCC)   Chronic diastolic CHF (congestive heart failure) (HCC)  History of present illness:  Patient coming from:  The patient is coming from SNF.  At baseline, pt is dependent for most of ADL.        Chief Complaint: SOB, Cyanosis, nausea, vomiting and incontinence of stool  HPI: Frances Jackson is a 85 y.o. female with medical history significant of hypertension, dementia, dHF, DNR/DNI, anxiety, who presents with shortness of breath, cyanosis, nausea, vomiting and incontinence of stool.   Per report, pt was noted to have vomiting with incontinent of stool at about 21:30 in SNF. She also has cyanotic lips and fingers. Oxygen saturation was 60% on room air which improved to 90% after giving sublingual nitroglycerin and being placed on a nonrebreather mask. Pt was hypertensive with Bp 223/110, then dropped to 92/62 after giving nitroglycerin, then 108/61 currently.  Patient is dry cough, but denies chest pain.  No diarrhea or abdominal pain.  No symptoms of UTI.  She moves all extremities. Pt has fever 101.1 in ED  ED Course: pt was found to have WBC 11.3, lactic acid 5.3, negative troponin, urinalysis (hazy appearance, trace amount of leukocyte, few bacteria, WBC 6-30), potassium 5.7 with hemolysis, AKI with creatinine 1.17, BUN 19, mild abnormal liver function (ALP 55, AST 88, ALT 17, total bilirubin 2.7).  Temperature well 1.1, tachycardia, tachypnea, oxygen saturation 92 to 96% on nonrebreather. chest x-ray showed bilateral patchy  opacity.  COVID-19 negataive and COVID10-related test pending.  Patient is admitted to stepdown as inpatient.  Hospital Course:  85 y.o.femalewith medical history significant ofhypertension, dementia,dHF, DNR/DNI, anxiety admitted on 07/29/2018 with acute hypoxic respiratory failure requiring BiPAP   Patient is found to be septic secondary to pneumonia with persistent elevated lactic acid levels and persistent hypoxia---  Patient's daughter Ms Frances Jackson [phone])... Is an Charity fundraiser.... Plan of care discussed with patient's daughter, she spoke with her brother who lives in [place]----and the family have decided to transition to full comfort measures without further active/aggressive treatment protocols  She also informed RN Nira Conn of the decision to transition to comfort care only  Comfort care protocol was instituted, palliative care input appreciated , patient expired with daughter at bedside  Time: Date and Time of Death--- Aug 09, 2018 at 1517 PM  Signed:  Idella Lamontagne  [facility] Aug 09, 2018, 3:30 PM

## 2018-08-26 NOTE — Progress Notes (Signed)
   Patient is currently comfort measures only  She is gravely ill with life expectancy of less than 2 weeks  Awaiting hospice consult--- possible transfer to residential hospice house  Shon Hale, MD

## 2018-08-26 NOTE — TOC Initial Note (Signed)
Transition of Care [facility]) - Initial/Assessment Note    Patient Details  Name: Frances Jackson MRN: [id number] Date of Birth: 1921-07-09  [facility]) CM/SW Contact:    Maree Krabbe, LCSW Phone Number: 2018/08/22, 11:17 AM  Clinical Narrative:     CSW spoke with pt's daughter via telephone. Pt is only alert to self. Pt was living at [facility] (ALF) prior to hospitalization. Pt's daughter is agreeable to Hospice and would prefer [facility]. Referral made to [facility] with [facility].               Expected Discharge Plan: Hospice Medical Facility Barriers to Discharge: No Barriers Identified   Patient Goals and CMS Choice Patient states their goals for this hospitalization and ongoing recovery are:: N/A      Expected Discharge Plan and Services Expected Discharge Plan: Hospice Medical Facility In-house Referral: Hospice / Palliative Care   Post Acute Care Choice: Hospice Living arrangements for the past 2 months: Assisted Living Facility                                      Prior Living Arrangements/Services Living arrangements for the past 2 months: Assisted Living Facility Lives with:: Self Patient language and need for interpreter reviewed:: Yes Do you feel safe going back to the place where you live?: No      Need for Family Participation in Patient Care: No (Comment) Care giver support system in place?: Yes (comment)(daughter)   Criminal Activity/Legal Involvement Pertinent to Current Situation/Hospitalization: No - Comment as needed  Activities of Daily Living      Permission Sought/Granted Permission sought to share information with : Family Supports, Magazine features editor    Share Information with NAME: Lupita Leash  Permission granted to share info w AGENCY: [facility]  Permission granted to share info w Relationship: Daughter     Emotional Assessment Appearance:: Appears stated  age Attitude/Demeanor/Rapport: Unable to Assess Affect (typically observed): Unable to Assess Orientation: : Oriented to Self Alcohol / Substance Use: Not Applicable Psych Involvement: No (comment)  Admission diagnosis:  Community acquired pneumonia, unspecified laterality [J18.9] Sepsis, due to unspecified organism, unspecified whether acute organ dysfunction present [facility]) [A41.9] Patient Active Problem List   Diagnosis Date Noted  . Sepsis (HCC) 07/29/2018  . HCAP (healthcare-associated pneumonia) 07/29/2018  . Abnormal LFTs 07/29/2018  . AKI (acute kidney injury) (HCC) 07/29/2018  . Aspiration pneumonia (HCC) 07/29/2018  . Acute respiratory failure with hypoxia and hypercapnia (HCC) 07/29/2018  . Chronic diastolic CHF (congestive heart failure) (HCC) 07/29/2018  . Palliative care encounter 08/17/2017  . Edema 05/08/2015  . Essential hypertension 05/08/2015  . Lactic acidosis 02/06/2015  . UTI (lower urinary tract infection) 02/06/2015   PCP:  System, Pcp Not In Pharmacy:  No Pharmacies Listed    Social Determinants of Health (SDOH) Interventions    Readmission Risk Interventions No flowsheet data found.

## 2018-08-26 DEATH — deceased

## 2019-12-03 IMAGING — DX PORTABLE CHEST - 1 VIEW
1 series · 1 of 1 positions shown · non-contrast
Comparison: 02/06/2015

CLINICAL DATA: Fever

EXAM:
PORTABLE CHEST 1 VIEW

[chest ap]
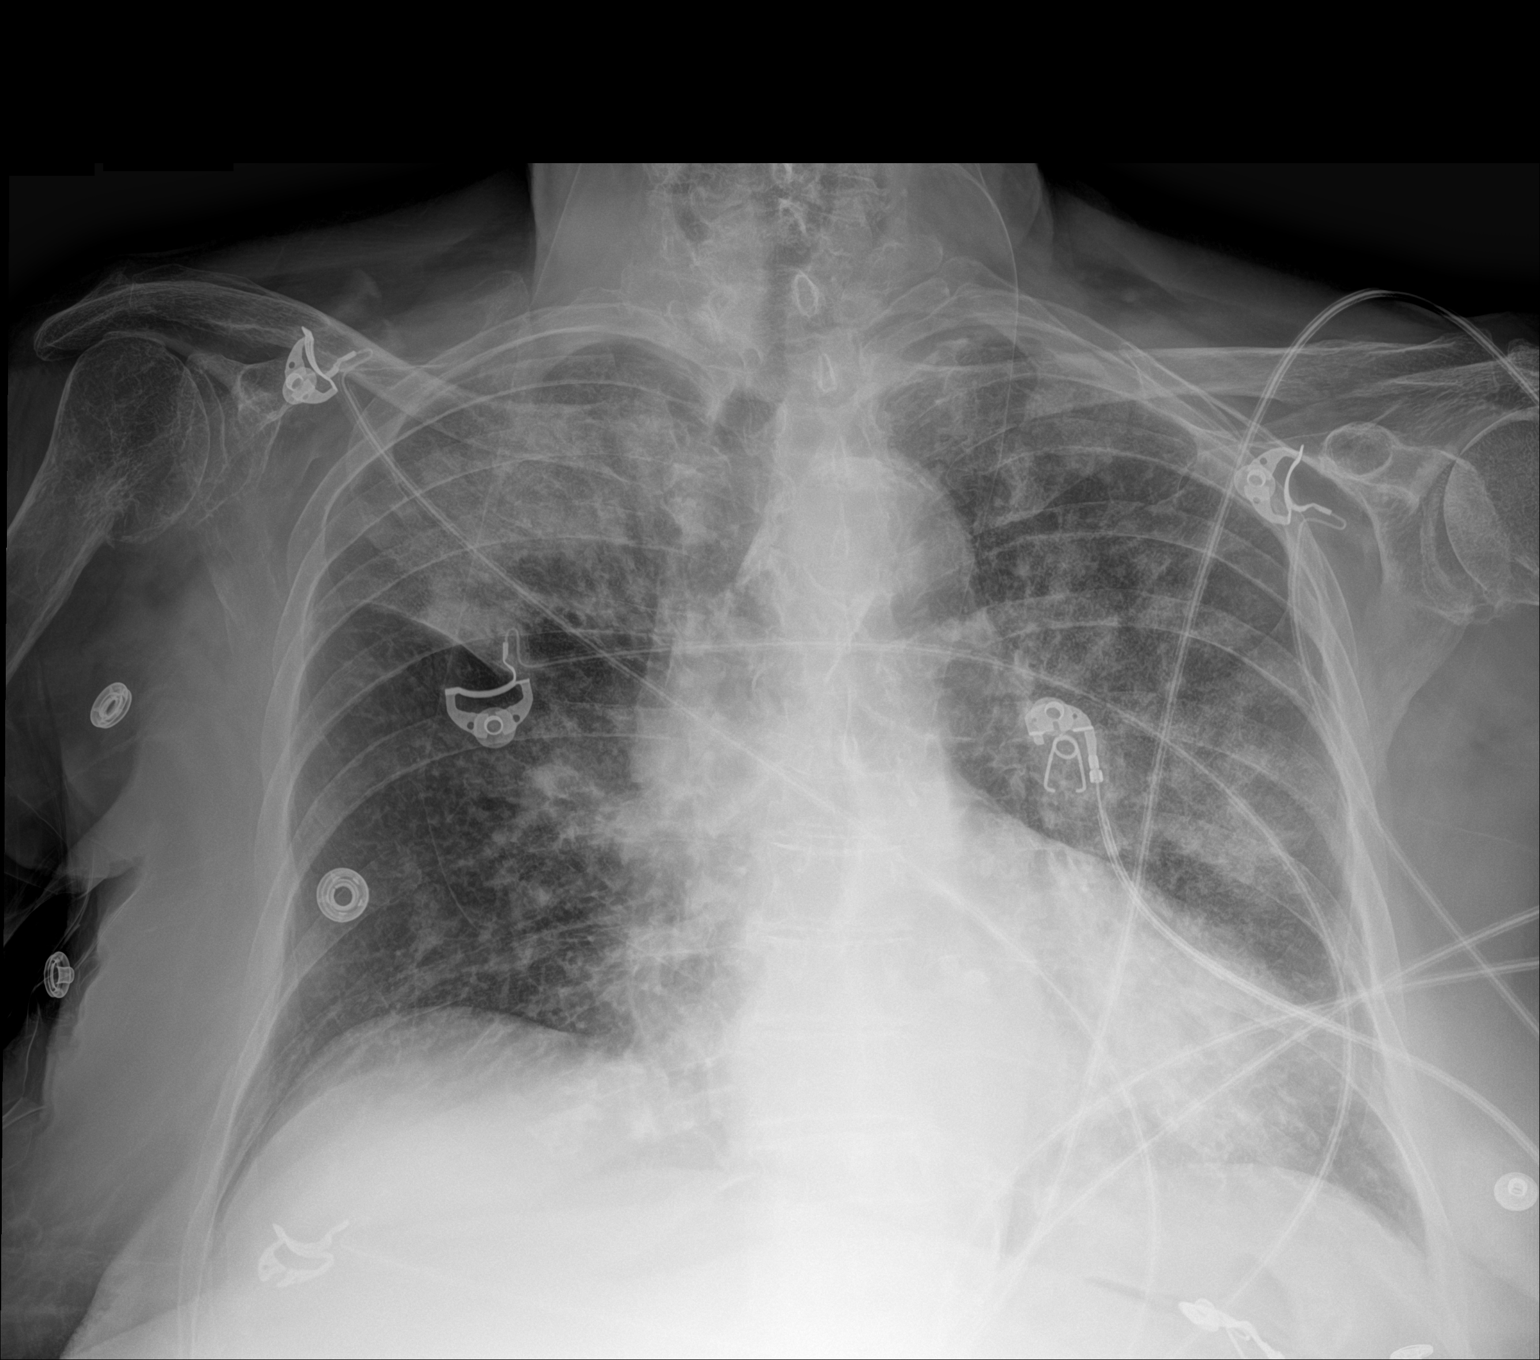

[1 of 1 positions shown; findings below may reference images not displayed]

FINDINGS: Bilateral airspace disease, most confluent in the right upper lobe
and diffuse on the left concerning for pneumonia. No effusions.
Heart is mildly enlarged. No acute bony abnormality.
IMPRESSION: Patchy bilateral airspace opacities, most confluent in the right
upper lobe, rather diffuse on the left. Findings concerning for
pneumonia. [id number] cannot be excluded.
